# Patient Record
Sex: Female | Born: 2000 | Hispanic: Yes | Marital: Married | Smoking: Never smoker
Health system: Southern US, Community
[De-identification: ages and names within clinical notes are randomized; demographics above are authoritative.]

---

## 2021-01-29 ENCOUNTER — Encounter (HOSPITAL_COMMUNITY): Payer: Self-pay | Admitting: Obstetrics and Gynecology

## 2021-01-29 ENCOUNTER — Inpatient Hospital Stay (HOSPITAL_COMMUNITY)
Admission: AD | Admit: 2021-01-29 | Discharge: 2021-01-30 | Disposition: A | Discharge status: Home / Self Care | Payer: Self-pay | Attending: Obstetrics and Gynecology | Admitting: Obstetrics and Gynecology

## 2021-01-29 DIAGNOSIS — Z20822 Contact with and (suspected) exposure to covid-19: Secondary | ICD-10-CM | POA: Insufficient documentation

## 2021-01-29 DIAGNOSIS — Z3A34 34 weeks gestation of pregnancy: Secondary | ICD-10-CM | POA: Insufficient documentation

## 2021-01-29 DIAGNOSIS — J111 Influenza due to unidentified influenza virus with other respiratory manifestations: Secondary | ICD-10-CM | POA: Insufficient documentation

## 2021-01-29 DIAGNOSIS — O26893 Other specified pregnancy related conditions, third trimester: Secondary | ICD-10-CM | POA: Insufficient documentation

## 2021-01-29 DIAGNOSIS — R109 Unspecified abdominal pain: Secondary | ICD-10-CM | POA: Insufficient documentation

## 2021-01-29 DIAGNOSIS — R102 Pelvic and perineal pain: Secondary | ICD-10-CM | POA: Insufficient documentation

## 2021-01-29 DIAGNOSIS — M545 Low back pain, unspecified: Secondary | ICD-10-CM | POA: Insufficient documentation

## 2021-01-29 DIAGNOSIS — O0933 Supervision of pregnancy with insufficient antenatal care, third trimester: Secondary | ICD-10-CM | POA: Insufficient documentation

## 2021-01-29 DIAGNOSIS — J101 Influenza due to other identified influenza virus with other respiratory manifestations: Secondary | ICD-10-CM

## 2021-01-29 DIAGNOSIS — O4700 False labor before 37 completed weeks of gestation, unspecified trimester: Secondary | ICD-10-CM

## 2021-01-29 DIAGNOSIS — O99513 Diseases of the respiratory system complicating pregnancy, third trimester: Secondary | ICD-10-CM | POA: Insufficient documentation

## 2021-01-29 LAB — URINALYSIS, MICROSCOPIC (REFLEX): WBC, UA: >50 WBC/hpf (ref 0–5)

## 2021-01-29 LAB — WET PREP, GENITAL
Sperm: NONE SEEN
Trich, Wet Prep: NONE SEEN
WBC, Wet Prep HPF POC: >=10 — AB (ref <10)
Yeast Wet Prep HPF POC: NONE SEEN

## 2021-01-29 LAB — BASIC METABOLIC PANEL
Anion gap: 8 (ref 5–15)
BUN: 5 mg/dL — ABNORMAL LOW (ref 6–20)
CO2: 22 mmol/L (ref 22–32)
Calcium: 8.1 mg/dL — ABNORMAL LOW (ref 8.9–10.3)
Chloride: 104 mmol/L (ref 98–111)
Creatinine, Ser: 0.48 mg/dL (ref 0.44–1.00)
GFR, Estimated: >60 mL/min (ref >60)
Glucose, Bld: 95 mg/dL (ref 70–99)
Potassium: 3.3 mmol/L — ABNORMAL LOW (ref 3.5–5.1)
Sodium: 134 mmol/L — ABNORMAL LOW (ref 135–145)

## 2021-01-29 LAB — CBC WITH DIFFERENTIAL/PLATELET
Abs Immature Granulocytes: 0.05 10*3/uL (ref 0.00–0.07)
Basophils Absolute: 0 10*3/uL (ref 0.0–0.1)
Basophils Relative: 0 %
Eosinophils Absolute: 0 10*3/uL (ref 0.0–0.5)
Eosinophils Relative: 0 %
HCT: 30.8 % — ABNORMAL LOW (ref 36.0–46.0)
Hemoglobin: 10.4 g/dL — ABNORMAL LOW (ref 12.0–15.0)
Immature Granulocytes: 1 %
Lymphocytes Relative: 15 %
Lymphs Abs: 1.5 10*3/uL (ref 0.7–4.0)
MCH: 29.2 pg (ref 26.0–34.0)
MCHC: 33.8 g/dL (ref 30.0–36.0)
MCV: 86.5 fL (ref 80.0–100.0)
Monocytes Absolute: 0.6 10*3/uL (ref 0.1–1.0)
Monocytes Relative: 6 %
Neutro Abs: 7.9 10*3/uL — ABNORMAL HIGH (ref 1.7–7.7)
Neutrophils Relative %: 78 %
Platelets: 239 10*3/uL (ref 150–400)
RBC: 3.56 MIL/uL — ABNORMAL LOW (ref 3.87–5.11)
RDW: 13.3 % (ref 11.5–15.5)
WBC: 10 10*3/uL (ref 4.0–10.5)
nRBC: 0 % (ref 0.0–0.2)

## 2021-01-29 LAB — URINALYSIS, ROUTINE W REFLEX MICROSCOPIC
Bilirubin Urine: NEGATIVE
Glucose, UA: NEGATIVE mg/dL
Hgb urine dipstick: NEGATIVE
Ketones, ur: >80 mg/dL — AB
Nitrite: NEGATIVE
Protein, ur: NEGATIVE mg/dL
Specific Gravity, Urine: 1.02 (ref 1.005–1.030)
pH: 6.5 (ref 5.0–8.0)

## 2021-01-29 LAB — RESP PANEL BY RT-PCR (FLU A&B, COVID) ARPGX2
Influenza A by PCR: POSITIVE — AB
Influenza B by PCR: NEGATIVE
SARS Coronavirus 2 by RT PCR: NEGATIVE

## 2021-01-29 LAB — OB RESULTS CONSOLE GC/CHLAMYDIA: Gonorrhea: NEGATIVE

## 2021-01-29 LAB — GROUP B STREP BY PCR: Group B strep by PCR: NEGATIVE

## 2021-01-29 MED ORDER — NIFEDIPINE 10 MG PO CAPS
10.0000 mg | ORAL_CAPSULE | ORAL | Status: DC | PRN
  Administered 2021-01-29 (×2): 10 mg via ORAL
  Filled 2021-01-29 (×3): qty 1

## 2021-01-29 MED ORDER — LACTATED RINGERS IV BOLUS
1000.0000 mL | Freq: Once | INTRAVENOUS | Status: AC
  Administered 2021-01-29: 1000 mL via INTRAVENOUS

## 2021-01-29 MED ORDER — OSELTAMIVIR PHOSPHATE 75 MG PO CAPS
75.0000 mg | ORAL_CAPSULE | Freq: Once | ORAL | Status: AC
  Administered 2021-01-30: 75 mg via ORAL
  Filled 2021-01-29: qty 1

## 2021-01-29 MED ORDER — ACETAMINOPHEN 500 MG PO TABS
1000.0000 mg | ORAL_TABLET | Freq: Once | ORAL | Status: AC
  Administered 2021-01-29: 1000 mg via ORAL
  Filled 2021-01-29: qty 2

## 2021-01-29 MED ORDER — LACTATED RINGERS IV SOLN: Freq: Once | INTRAVENOUS | Status: AC

## 2021-01-29 NOTE — MAU Provider Note (Addendum)
Chief Complaint:  Abdominal Pain   Event Date/Time   First Provider Initiated Contact with Patient 01/29/21 2101   Interpretor used throughout   HPI: Latoya Lane is a 20 y.o. G3P1001 at 25w3dwho presents to maternity admissions reporting pain in groin and low back to RN.  To me she states she has lower abdominal pain.  Denies sore throat or cough when told she has a fever. States has a little congestion.  States walked from [place] to [place] 2 months ago.  Living with parents here. No prenatal care  States HD told her to call [facility], and she got an answering machine. . She reports good fetal movement, denies LOF, vaginal bleeding, vaginal itching/burning, urinary symptoms, h/a, dizziness, n/v, diarrhea  Abdominal Pain This is a new problem. The current episode started in the past 7 days. The onset quality is gradual. The problem occurs constantly. The quality of the pain is described as cramping. The pain radiates to the back. Associated symptoms include a fever. Pertinent negatives include no frequency, headaches, myalgias or nausea. Nothing relieves the symptoms. Past treatments include nothing.   RN Note: PT SAYS  WITH INTERPRETER- PT CAME HERE 2 MTHS - FROM [place] NO PNC THERE EITHER - ONLY 1 VISIT SAYS HER TAIL BONE AND GROINS HURT- STARTED Sunday - NO MEDS  LAST SEX- WHEN PREG   Past Medical History: History reviewed. No pertinent past medical history.  Past obstetric history: OB History  Gravida Para Term Preterm AB Living  3 1 1     1   SAB IAB Ectopic Multiple Live Births          1    # Outcome Date GA Lbr Len/2nd Weight Sex Delivery Anes PTL Lv  3 Current           2 Term           1 Gravida      Vag-Spont       Past Surgical History: History reviewed. No pertinent surgical history.  Family History: History reviewed. No pertinent family history.  Social History: Social History   Tobacco Use   Smoking status: Never   Smokeless tobacco: Never   Substance Use Topics   Alcohol use: Never   Drug use: Never    Allergies: No Known Allergies  Meds:  No medications prior to admission.    I have reviewed patient's Past Medical Hx, Surgical Hx, Family Hx, Social Hx, medications and allergies.   ROS:  Review of Systems  Constitutional:  Positive for fever.  Gastrointestinal:  Positive for abdominal pain. Negative for nausea.  Genitourinary:  Negative for frequency.  Musculoskeletal:  Negative for myalgias.  Neurological:  Negative for headaches.  Other systems negative  Physical Exam  Patient Vitals for the past 24 hrs:  BP Temp Temp src Pulse Resp Height Weight  01/29/21 2029 111/71 (!) 101.3 F (38.5 C) Oral (!) 108 20 5' (1.524 m) 53.3 kg   Constitutional: Well-developed, well-nourished female in no acute distress.  Cardiovascular: normal rate and rhythm Respiratory: normal effort, clear to auscultation bilaterally  Oxygen saturation 100% GI: Abd soft, non-tender, gravid appropriate for gestational age.   No rebound or guarding. MS: Extremities nontender, no edema, normal ROM Neurologic: Alert and oriented x 4.  GU: Neg CVAT.  PELVIC EXAM: Cultures and GBS done Dilation: Fingertip Effacement (%): Thick Cervical Position: Posterior Station: -3 Presentation: Undeterminable Exam by:: Khaleah Duer CNM    FHT:  Baseline 150 , moderate variability, accelerations  present, no decelerations Contractions: q 3 mins Irregular  UCs are mild and short   Labs: Results for orders placed or performed during the hospital encounter of 01/29/21 (from the past 24 hour(s))  Urinalysis, Routine w reflex microscopic Urine, Clean Catch     Status: Abnormal   Collection Time: 01/29/21  8:47 PM  Result Value Ref Range   Color, Urine YELLOW YELLOW   APPearance CLOUDY (A) CLEAR   Specific Gravity, Urine 1.020 1.005 - 1.030   pH 6.5 5.0 - 8.0   Glucose, UA NEGATIVE NEGATIVE mg/dL   Hgb urine dipstick NEGATIVE NEGATIVE   Bilirubin Urine  NEGATIVE NEGATIVE   Ketones, ur >80 (A) NEGATIVE mg/dL   Protein, ur NEGATIVE NEGATIVE mg/dL   Nitrite NEGATIVE NEGATIVE   Leukocytes,Ua MODERATE (A) NEGATIVE  Urinalysis, Microscopic (reflex)     Status: Abnormal   Collection Time: 01/29/21  8:47 PM  Result Value Ref Range   RBC / HPF 0-5 0 - 5 RBC/hpf   WBC, UA >50 0 - 5 WBC/hpf   Bacteria, UA FEW (A) NONE SEEN   Squamous Epithelial / LPF 21-50 0 - 5  Resp Panel by RT-PCR (Flu A&B, Covid) Urine, Clean Catch     Status: Abnormal   Collection Time: 01/29/21  8:48 PM   Specimen: Urine, Clean Catch; Nasopharyngeal(NP) swabs in vial transport medium  Result Value Ref Range   SARS Coronavirus 2 by RT PCR NEGATIVE NEGATIVE   Influenza A by PCR POSITIVE (A) NEGATIVE   Influenza B by PCR NEGATIVE NEGATIVE  Wet prep, genital     Status: Abnormal   Collection Time: 01/29/21  9:27 PM  Result Value Ref Range   Yeast Wet Prep HPF POC NONE SEEN NONE SEEN   Trich, Wet Prep NONE SEEN NONE SEEN   Clue Cells Wet Prep HPF POC PRESENT (A) NONE SEEN   WBC, Wet Prep HPF POC >=10 (A) <10   Sperm NONE SEEN   Group B strep by PCR     Status: None   Collection Time: 01/29/21  9:29 PM   Specimen: Genital  Result Value Ref Range   Group B strep by PCR NEGATIVE NEGATIVE  CBC with Differential/Platelet     Status: Abnormal   Collection Time: 01/29/21 10:08 PM  Result Value Ref Range   WBC 10.0 4.0 - 10.5 K/uL   RBC 3.56 (L) 3.87 - 5.11 MIL/uL   Hemoglobin 10.4 (L) 12.0 - 15.0 g/dL   HCT 79.0 (L) 24.0 - 97.3 %   MCV 86.5 80.0 - 100.0 fL   MCH 29.2 26.0 - 34.0 pg   MCHC 33.8 30.0 - 36.0 g/dL   RDW 53.2 99.2 - 42.6 %   Platelets 239 150 - 400 K/uL   nRBC 0.0 0.0 - 0.2 %   Neutrophils Relative % 78 %   Neutro Abs 7.9 (H) 1.7 - 7.7 K/uL   Lymphocytes Relative 15 %   Lymphs Abs 1.5 0.7 - 4.0 K/uL   Monocytes Relative 6 %   Monocytes Absolute 0.6 0.1 - 1.0 K/uL   Eosinophils Relative 0 %   Eosinophils Absolute 0.0 0.0 - 0.5 K/uL   Basophils Relative  0 %   Basophils Absolute 0.0 0.0 - 0.1 K/uL   Immature Granulocytes 1 %   Abs Immature Granulocytes 0.05 0.00 - 0.07 K/uL  Basic metabolic panel     Status: Abnormal   Collection Time: 01/29/21 10:08 PM  Result Value Ref Range   Sodium 134 (  L) 135 - 145 mmol/L   Potassium 3.3 (L) 3.5 - 5.1 mmol/L   Chloride 104 98 - 111 mmol/L   CO2 22 22 - 32 mmol/L   Glucose, Bld 95 70 - 99 mg/dL   BUN 5 (L) 6 - 20 mg/dL   Creatinine, Ser 4.80 0.44 - 1.00 mg/dL   Calcium 8.1 (L) 8.9 - 10.3 mg/dL   GFR, Estimated >16 >55 mL/min   Anion gap 8 5 - 15      Imaging:  No results found.  MAU Course/MDM: I have ordered labs and reviewed results. Positive for Flu A.  NST reviewed, noted uterine irritability/contractions.  Treatments in MAU included Procardia series x 2 doses (BP was too low).  So we gave her 2 liters of IV fluids which slowed contractions We also gave Tylenol for fever We also gave her first dose of Tamiflu .    Assessment: Single IUP at [redacted]w[redacted]d Pelvic cramping/preterm contractions, likely due to Flu and dehydration Influenza A No prenatal care  Plan: Discharge home Rx Tamiflu given Rx Prenatal vitamins Preterm Labor precautions and fetal kick counts Follow up in Office for prenatal visits    Message sent to office to  try to schedule her a new OB visit  Pt stable at time of discharge.  Wynelle Bourgeois CNM, MSN Certified Nurse-Midwife 01/29/2021 9:01 PM

## 2021-01-29 NOTE — MAU Note (Signed)
PT SAYS  WITH INTERPRETER- PT CAME HERE 2 MTHS - FROM [place] NO PNC THERE EITHER - ONLY 1 VISIT SAYS HER TAIL BONE AND GROINS HURT- STARTED Sunday - NO MEDS  LAST SEX- WHEN PREG

## 2021-01-30 DIAGNOSIS — O4700 False labor before 37 completed weeks of gestation, unspecified trimester: Secondary | ICD-10-CM

## 2021-01-30 DIAGNOSIS — Z3A34 34 weeks gestation of pregnancy: Secondary | ICD-10-CM

## 2021-01-30 LAB — GC/CHLAMYDIA PROBE AMP (~~LOC~~) NOT AT ARMC
Chlamydia: NEGATIVE
Comment: NEGATIVE
Comment: NORMAL
Neisseria Gonorrhea: NEGATIVE

## 2021-01-30 LAB — CULTURE, OB URINE: Culture: >=10000 — AB

## 2021-01-30 MED ORDER — PRENATAL VITAMIN 27-0.8 MG PO TABS: 1.0000 | ORAL_TABLET | Freq: Every day | ORAL | 12 refills | Status: AC

## 2021-01-30 MED ORDER — OSELTAMIVIR PHOSPHATE 75 MG PO CAPS: 75.0000 mg | ORAL_CAPSULE | Freq: Two times a day (BID) | ORAL | 0 refills | Status: DC

## 2021-02-17 NOTE — L&D Delivery Note (Signed)
OB/GYN Faculty Practice Delivery Note  Latoya Lane is a 21 y.o. G2P1001 s/p VD at [redacted]w[redacted]d. She was admitted for a post dates IOL.   Spanish interpreter present for the duration of care.   ROM: 0h 35m with clear fluid GBS Status:  NEGATIVE/-- (12/13 2129) Maximum Maternal Temperature: 48F  Labor Progress: Initial SVE: 3.5/70/-3. She was augmented with pit and then progressed quickly to complete after AROM (5 to 10cm in 44 minutes)   Delivery Date/Time: 1/26 at 1729  Delivery: Called to room and patient was complete and pushing. Head delivered R OA. Nuchal cord present and delivered through. Posterior compound arm present and delivered before the remainder of shoulder and body. Infant with spontaneous cry, placed on mother's abdomen, dried and stimulated. Cord clamped x 2 after 1-minute delay, and cut by provider. Cord blood drawn. Placenta delivered spontaneously with gentle cord traction. Fundus firm with massage and Pitocin. Labia, perineum, vagina, and cervix inspected with no lacerations.  Baby Weight: pending  Placenta: 3 vessel, intact. Sent to L&D Complications: None Lacerations: None EBL: 250 mL Analgesia: None   Infant:  APGAR (1 MIN): 9   APGAR (5 MINS): 9    Darrelyn Hillock, DO  OB Family Medicine Fellow, [facility] for [facility] 03/14/2021, 5:51 PM

## 2021-02-25 ENCOUNTER — Ambulatory Visit (INDEPENDENT_AMBULATORY_CARE_PROVIDER_SITE_OTHER): Payer: Self-pay | Admitting: Family Medicine

## 2021-02-25 ENCOUNTER — Encounter: Payer: Self-pay | Admitting: Family Medicine

## 2021-02-25 ENCOUNTER — Other Ambulatory Visit: Payer: Self-pay

## 2021-02-25 VITALS — BP 110/71 | HR 72 | Wt 125.1 lb

## 2021-02-25 DIAGNOSIS — Z3A38 38 weeks gestation of pregnancy: Secondary | ICD-10-CM

## 2021-02-25 DIAGNOSIS — O26843 Uterine size-date discrepancy, third trimester: Secondary | ICD-10-CM

## 2021-02-25 DIAGNOSIS — Z789 Other specified health status: Secondary | ICD-10-CM

## 2021-02-25 DIAGNOSIS — O0930 Supervision of pregnancy with insufficient antenatal care, unspecified trimester: Secondary | ICD-10-CM

## 2021-02-25 DIAGNOSIS — Z3493 Encounter for supervision of normal pregnancy, unspecified, third trimester: Secondary | ICD-10-CM

## 2021-02-25 DIAGNOSIS — Z3480 Encounter for supervision of other normal pregnancy, unspecified trimester: Secondary | ICD-10-CM | POA: Insufficient documentation

## 2021-02-25 LAB — POCT URINALYSIS DIP (DEVICE)
Bilirubin Urine: NEGATIVE
Glucose, UA: NEGATIVE mg/dL
Hgb urine dipstick: NEGATIVE
Ketones, ur: NEGATIVE mg/dL
Nitrite: NEGATIVE
Protein, ur: NEGATIVE mg/dL
Specific Gravity, Urine: 1.025 (ref 1.005–1.030)
Urobilinogen, UA: 0.2 mg/dL (ref 0.0–1.0)
pH: 6 (ref 5.0–8.0)

## 2021-02-25 NOTE — Progress Notes (Signed)
Scheduled ultrasound at [facility] 1/23 @ 930

## 2021-02-25 NOTE — Progress Notes (Addendum)
History:   Latoya Lane is a 21 y.o. G2P0001 at [redacted]w[redacted]d by LMP being seen today for her first obstetrical visit.  She just moved here in 11/2021 from [place] (her parents live here) and has received no prenatal care during this pregnancy. Her obstetrical history is significant for  a normal SVD in 2021, healthy son now 1 year 21 months old . No complications in that pregnancy. Patient does intend to breast feed. Pregnancy history fully reviewed.  Patient reports  occasional pelvic pain when she sits or stands for long periods of time .   Reports feeling sure of her LMP. Not on contraception before with normal last menstrual cycle. Previously monthly cycles.   Spanish interpreter used.    HISTORY: OB History  Gravida Para Term Preterm AB Living  2 0 0 0 0 1  SAB IAB Ectopic Multiple Live Births  0 0 0 0 1    # Outcome Date GA Lbr Len/2nd Weight Sex Delivery Anes PTL Lv  2 Current           1 Gravida      Vag-Spont        History reviewed. No pertinent past medical history. History reviewed. No pertinent surgical history. History reviewed. No pertinent family history. Social History   Tobacco Use   Smoking status: Never   Smokeless tobacco: Never  Substance Use Topics   Alcohol use: Never   Drug use: Never   No Known Allergies Current Outpatient Medications on File Prior to Visit  Medication Sig Dispense Refill   Prenatal Vit-Fe Fumarate-FA (PRENATAL VITAMIN) 27-0.8 MG TABS Take 1 tablet by mouth daily. 30 tablet 12   oseltamivir (TAMIFLU) 75 MG capsule Take 1 capsule (75 mg total) by mouth every 12 (twelve) hours. (Patient not taking: Reported on 02/25/2021) 10 capsule 0   No current facility-administered medications on file prior to visit.    Review of Systems Pertinent items noted in HPI and remainder of comprehensive ROS otherwise negative. Physical Exam:   Vitals:   02/25/21 1026  BP: 110/71  Pulse: 72  Weight: 125 lb 1.6 oz (56.7 kg)   Fetal Heart Rate  (bpm): 146 Fundal height 35cm.   Constitutional: Well-developed, well-nourished pregnant female in no acute distress.  HEENT: PERRLA Skin: normal color and turgor, no rash Cardiovascular: normal rate  Respiratory: normal effort GI: Abd soft, non-tender, gravid uterus  MS: Extremities nontender, no edema, normal ROM Neurologic: Alert and oriented x 4.  Pelvic: NEFG, physiologic discharge, no blood.  Bedside US confirmed VERTEX presentation.   Dilation: 1.5 Effacement (%): 40 Station: -3 Presentation: Vertex   Assessment:    Pregnancy: G2P0001 [redacted] weeks gestation by LMP    Plan:    1. Supervision of low-risk pregnancy, third trimester Doing well with normal fetal movement.  - CBC/D/Plt+RPR+Rh+ABO+RubIgG... - Hemoglobin A1c - [place] MFM OB DETAIL +14 WK; Future - AMBULATORY REFERRAL TO [facility]  2. [redacted] weeks gestation of pregnancy  3. Fundal height low for dates in third trimester Measuring around 34-35cm. No previous US to assess fetal growth. She is receiving PNC through the [employer] program with next available [place] on 1/23. Will see if able to switch locations for earlier assessment. May also be with fetal movement lower into her pelvis with advanced gestation.     Initial labs drawn. Continue prenatal vitamins. Problem list reviewed and updated. Ultrasound discussed and ordered.  Encouraged to complete MyChart Registration for her ability to review results, send requests,  and have questions addressed.  The nature of [facility]ulty Practice with multiple MDs and Advanced Practice Providers was explained to patient; also emphasized that residents, students are part of our team. Routine obstetric precautions reviewed. Encouraged to seek out care at office or emergency room [facility] MAU preferred) for urgent and/or emergent concerns.  Return in about 1 week (around 03/04/2021) for LROB.     Leticia Penna, DO Ob Fellow   02/25/2021   2:50 PM

## 2021-02-25 NOTE — Progress Notes (Signed)
Patient reports daily pain in pelvis

## 2021-02-26 LAB — CBC/D/PLT+RPR+RH+ABO+RUBIGG...
Antibody Screen: NEGATIVE
Basophils Absolute: 0.1 10*3/uL (ref 0.0–0.2)
Basos: 1 %
EOS (ABSOLUTE): 0.2 10*3/uL (ref 0.0–0.4)
Eos: 2 %
HCV Ab: <0.1 s/co ratio (ref 0.0–0.9)
HIV Screen 4th Generation wRfx: NONREACTIVE
Hematocrit: 31.8 % — ABNORMAL LOW (ref 34.0–46.6)
Hemoglobin: 10.5 g/dL — ABNORMAL LOW (ref 11.1–15.9)
Hepatitis B Surface Ag: NEGATIVE
Immature Grans (Abs): 0.1 10*3/uL (ref 0.0–0.1)
Immature Granulocytes: 1 %
Lymphocytes Absolute: 3.4 10*3/uL — ABNORMAL HIGH (ref 0.7–3.1)
Lymphs: 38 %
MCH: 27.5 pg (ref 26.6–33.0)
MCHC: 33 g/dL (ref 31.5–35.7)
MCV: 83 fL (ref 79–97)
Monocytes Absolute: 0.6 10*3/uL (ref 0.1–0.9)
Monocytes: 7 %
Neutrophils Absolute: 4.7 10*3/uL (ref 1.4–7.0)
Neutrophils: 51 %
Platelets: 267 10*3/uL (ref 150–450)
RBC: 3.82 x10E6/uL (ref 3.77–5.28)
RDW: 13.8 % (ref 11.7–15.4)
RPR Ser Ql: NONREACTIVE
Rh Factor: POSITIVE
Rubella Antibodies, IGG: <0.9 index — ABNORMAL LOW (ref >0.99)
WBC: 9 10*3/uL (ref 3.4–10.8)

## 2021-02-26 LAB — HEMOGLOBIN A1C
Est. average glucose Bld gHb Est-mCnc: 111 mg/dL
Hgb A1c MFr Bld: 5.5 % (ref 4.8–5.6)

## 2021-02-26 LAB — HCV INTERPRETATION

## 2021-03-01 ENCOUNTER — Encounter: Payer: Self-pay | Admitting: *Deleted

## 2021-03-04 ENCOUNTER — Encounter: Payer: Self-pay | Admitting: Obstetrics and Gynecology

## 2021-03-04 ENCOUNTER — Ambulatory Visit (INDEPENDENT_AMBULATORY_CARE_PROVIDER_SITE_OTHER): Payer: Self-pay | Admitting: Obstetrics and Gynecology

## 2021-03-04 ENCOUNTER — Other Ambulatory Visit: Payer: Self-pay

## 2021-03-04 ENCOUNTER — Other Ambulatory Visit: Payer: Self-pay | Admitting: Family Medicine

## 2021-03-04 ENCOUNTER — Ambulatory Visit (INDEPENDENT_AMBULATORY_CARE_PROVIDER_SITE_OTHER): Payer: Self-pay

## 2021-03-04 VITALS — BP 118/76 | HR 63 | Wt 126.8 lb

## 2021-03-04 DIAGNOSIS — O26843 Uterine size-date discrepancy, third trimester: Secondary | ICD-10-CM

## 2021-03-04 DIAGNOSIS — Z789 Other specified health status: Secondary | ICD-10-CM

## 2021-03-04 DIAGNOSIS — O09899 Supervision of other high risk pregnancies, unspecified trimester: Secondary | ICD-10-CM | POA: Insufficient documentation

## 2021-03-04 DIAGNOSIS — Z3480 Encounter for supervision of other normal pregnancy, unspecified trimester: Secondary | ICD-10-CM

## 2021-03-04 DIAGNOSIS — Z2839 Other underimmunization status: Secondary | ICD-10-CM | POA: Insufficient documentation

## 2021-03-04 DIAGNOSIS — O0933 Supervision of pregnancy with insufficient antenatal care, third trimester: Secondary | ICD-10-CM

## 2021-03-04 IMAGING — US US FETAL BPP W/ NON-STRESS
1 series · 14 of 14 positions shown · non-contrast
Comparison: none

[Series 1: us fetal bpp w/ non-stress · 14 acquisitions, 14 frames shown]
[im 1/14]
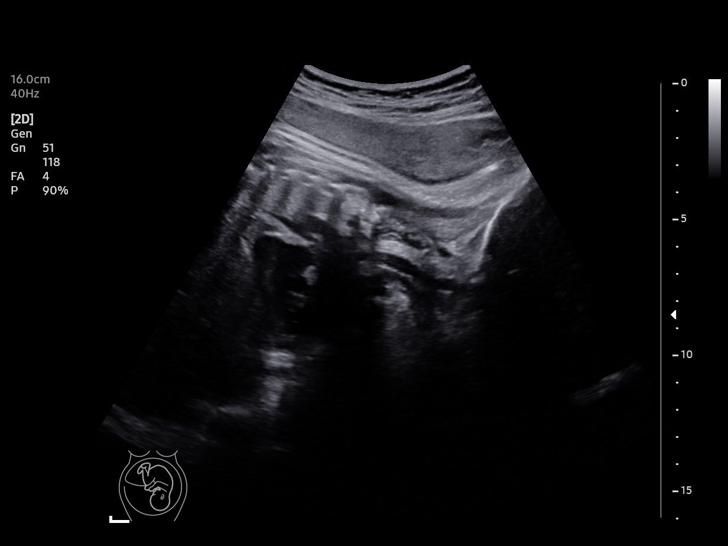
[im 2/14]
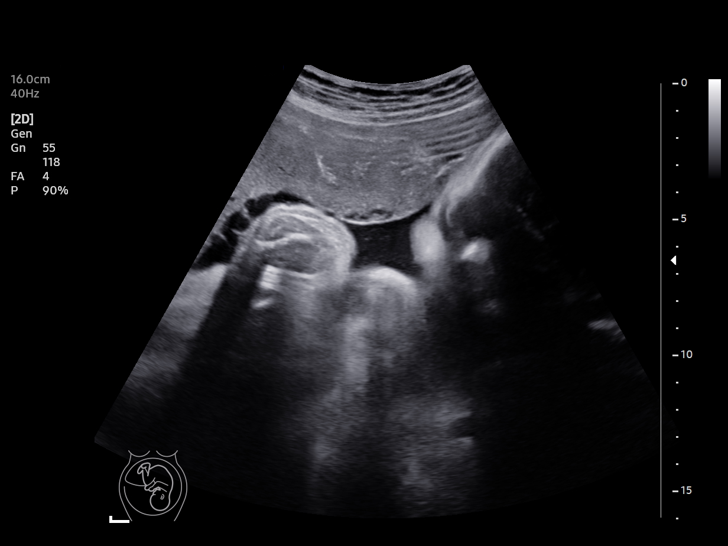
[im 3/14]
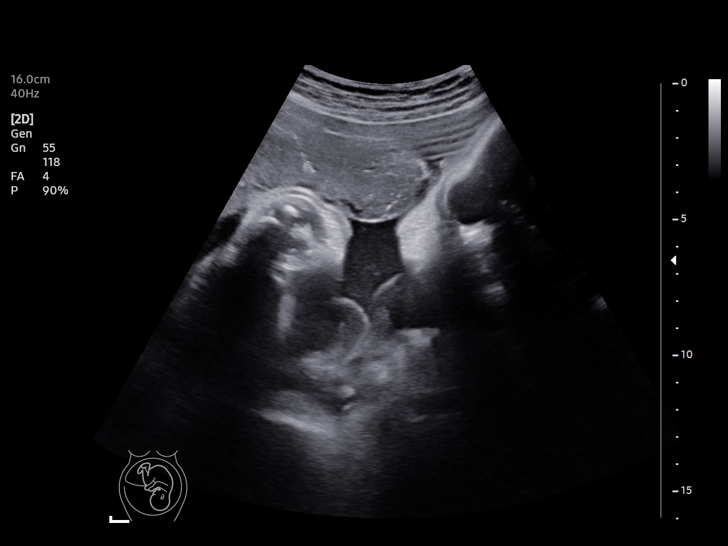
[im 4/14]
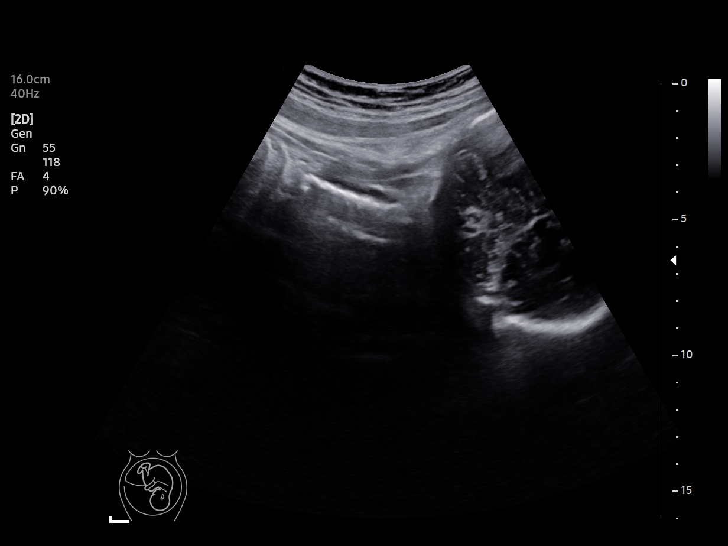
[im 5/14]
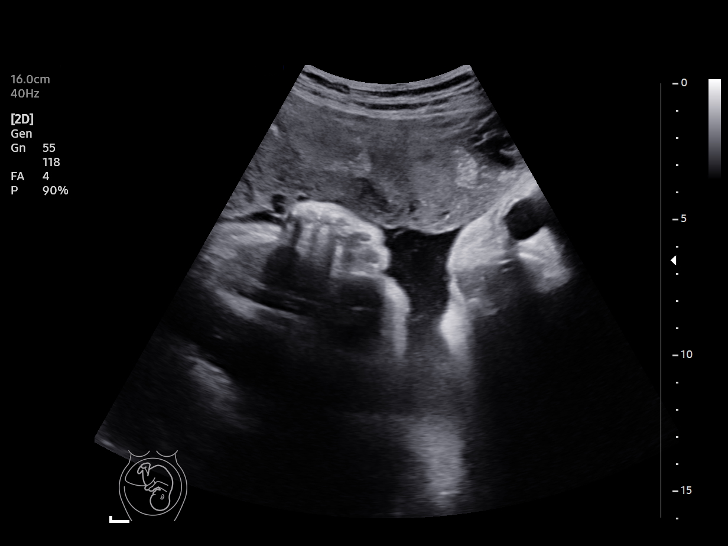
[im 6/14]
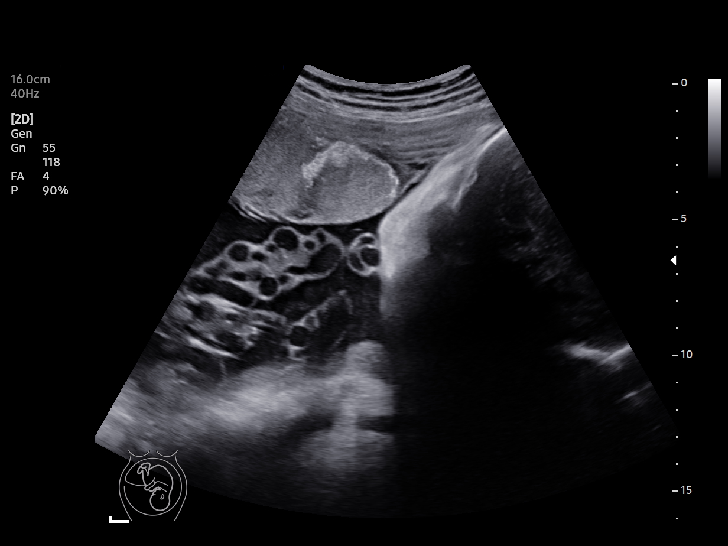
[im 7/14]
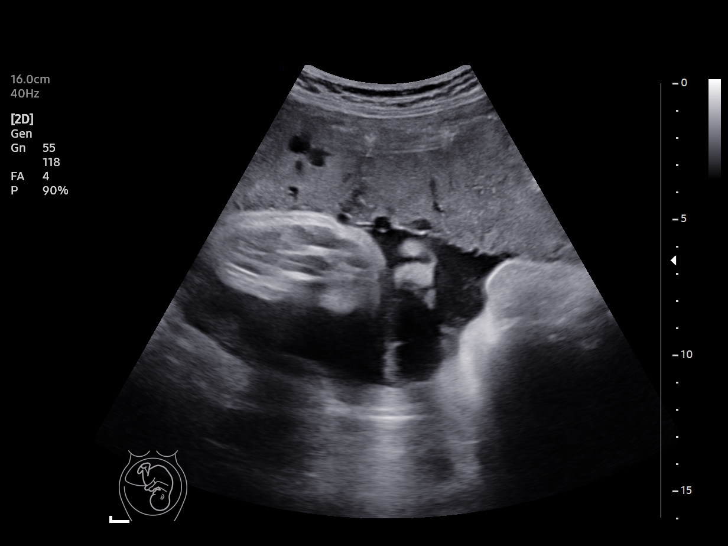
[im 8/14]
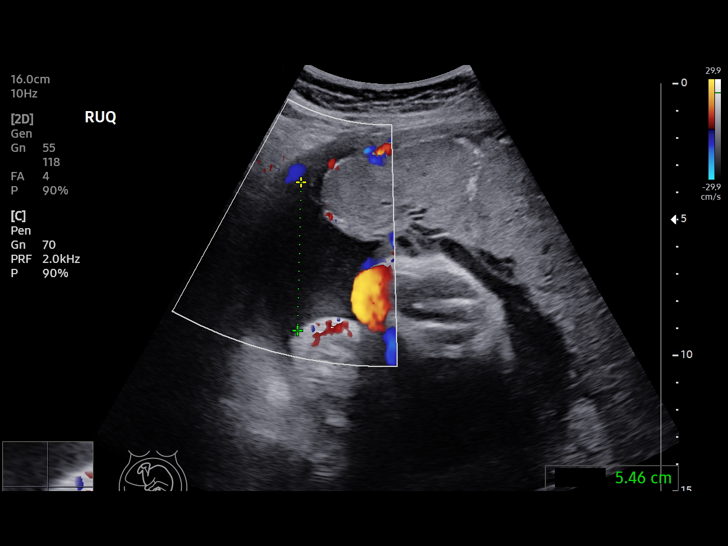
[im 9/14]
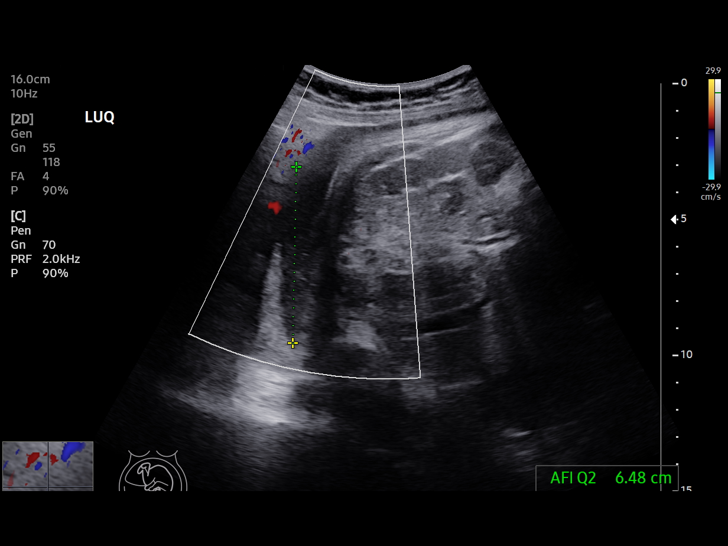
[im 10/14]
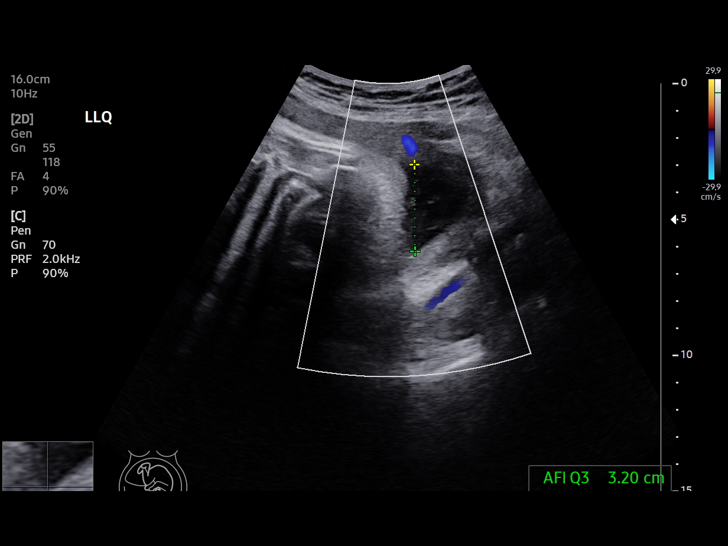
[im 11/14]
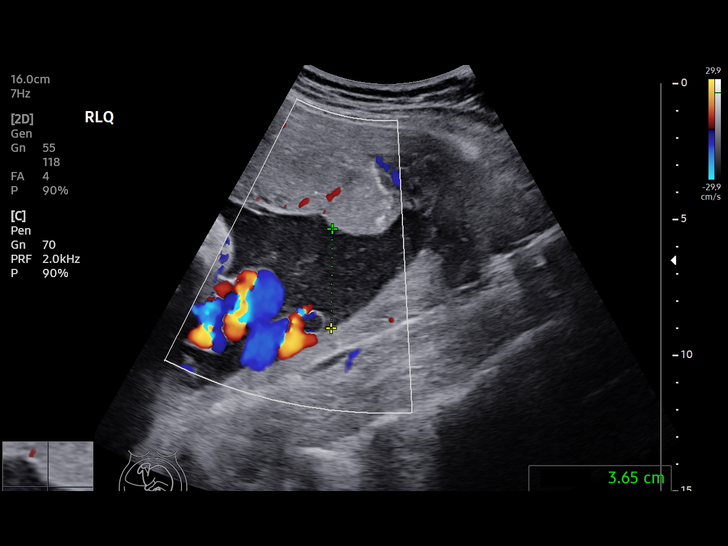
[im 12/14]
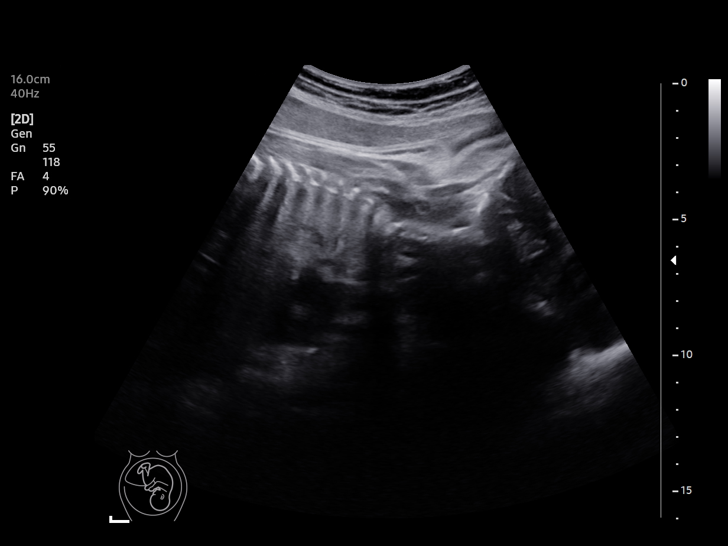
[im 13/14]
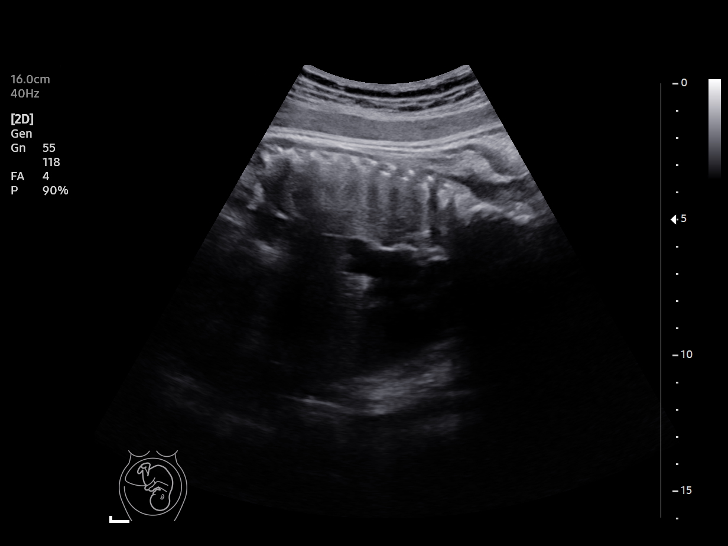
[im 14/14]
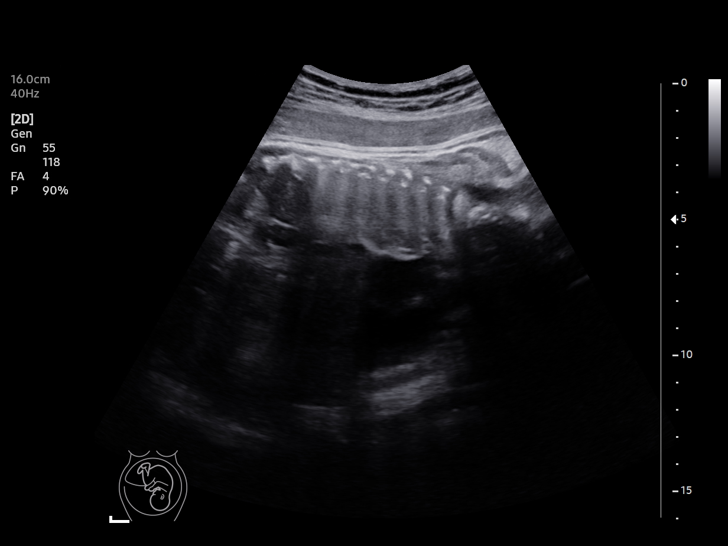

[14 of 14 positions shown; findings below may reference images not displayed]

[REDACTED]care at

 1  US FETAL BPP W/NONSTRESS              76818.4     SVETLANA GANDRABUR

Service(s) Provided

Indications

 Weeks of gestation of pregnancy not
 specified
 Insufficient Prenatal Care                     [HN]
 Uterine size-date discrepancy, third trimester [HN]
Fetal Evaluation

 Num Of Fetuses:         1
 Preg. Location:         Intrauterine
 Cardiac Activity:       Observed
 Presentation:           Cephalic

 Amniotic Fluid
 AFI FV:      Within normal limits

 AFI Sum(cm)                 Largest Pocket(cm)


 RUQ(cm)       RLQ(cm)       LUQ(cm)        LLQ(cm)


 Comment:    Breathing noted intermittently, but not sustained.
Biophysical Evaluation
 Amniotic F.V:   Pocket => 2 cm             F. Tone:        Observed
 F. Movement:    Observed                   N.S.T:          Reactive
 F. Breathing:   Not Observed               Score:          [DATE]
Impression

 Reassuring antenatal care
Recommendations

 Continue weekly antenatal testing till delivery .
                SVETLANA GANDRABUR

## 2021-03-04 NOTE — Progress Notes (Signed)
° °  PRENATAL VISIT NOTE  Subjective:  Latoya Lane is a 21 y.o. G2P0001 at [redacted]w[redacted]d being seen today for ongoing prenatal care.  She is currently monitored for the following issues for this high-risk pregnancy and has Supervision of other normal pregnancy, antepartum; Fundal height low for dates, third trimester; Late prenatal care; Language barrier; and Rubella non-immune status, antepartum on their problem list.  Patient reports no complaints.  Contractions: Irritability. Vag. Bleeding: None.  Movement: Present. Denies leaking of fluid.   The following portions of the patient's history were reviewed and updated as appropriate: allergies, current medications, past family history, past medical history, past social history, past surgical history and problem list.   Objective:   Vitals:   03/04/21 1520  BP: 118/76  Pulse: 63  Weight: 126 lb 12.8 oz (57.5 kg)    Fetal Status: Fetal Heart Rate (bpm): 146 Fundal Height: 37 cm Movement: Present  Presentation: Vertex  General:  Alert, oriented and cooperative. Patient is in no acute distress.  Skin: Skin is warm and dry. No rash noted.   Cardiovascular: Normal heart rate noted  Respiratory: Normal respiratory effort, no problems with respiration noted  Abdomen: Soft, gravid, appropriate for gestational age.  Pain/Pressure: Present     Pelvic: Cervical exam performed in the presence of a chaperone Dilation: 2 Effacement (%): 50 Station: -2  Extremities: Normal range of motion.  Edema: Trace  Mental Status: Normal mood and affect. Normal behavior. Normal judgment and thought content.   Assessment and Plan:  Pregnancy: G2P0001 at [redacted]w[redacted]d 1. Supervision of other normal pregnancy, antepartum Has 1/23 [place] anatomy u/s. Pt states she has never had an u/s this pregnancy from anyone and that her periods were qmonth and regular prior to pregnancy. FH is borderline low but patient is small too. Labs from last visit normal  F/u [place] u/s next  week Will get an nst/bpp today and next week given poor dates, with [place] u/s Pt and mom amenable to 41wk post dates IOL  2. Insufficient prenatal care in third trimester  3.  Language barrier Interpreter used  Term labor symptoms and general obstetric precautions including but not limited to vaginal bleeding, contractions, leaking of fluid and fetal movement were reviewed in detail with the patient. Please refer to After Visit Summary for other counseling recommendations.   Return in about 1 week (around 03/11/2021) for 7-9 days for LOB f/u - *Needs transportation*.  Future Appointments  Date Time Provider Leroy  03/11/2021  3:55 PM Osborne Oman, MD [facility]  03/14/2021  6:30 AM [facility] SCHED ROOM [facility] None    Aletha Halim, MD

## 2021-03-04 NOTE — Patient Instructions (Signed)
AREA PEDIATRIC/FAMILY PRACTICE PHYSICIANS  Central/[place]outheast [place] ([address]) [facility] Chambliss, MD; Eniola, MD; Hale, MD; Hensel, MD; McDiarmid, MD; McIntyer, MD; Neal, MD; Walden, MD [address] [place], [place] [address] [phone] Mon-Fri 8:30-12:30, 1:30-5:00 Providers come to see babies at [facility] Accepting Medicaid [facility] providers who accept newborns: Koirala, MD; Morrow, MD; Wolters, MD [address], [place], [place] [address] [phone] Mon-Fri 8:00-5:30 Babies seen by providers at [facility] Does NOT accept Medicaid Please call early in hospitalization for appointment (limited availability)  [facility] Mulberry, MD [address] [place], [place] [address] [phone] Mon, Tue, Thur, Fri 8:30-5:00, Wed 10:00-7:00 (closed 1-2pm) Babies seen by [facility] providers Accepting Medicaid Rubin - Pediatrician Rubin, MD [address], [place], [place] [address] [phone] Mon-Fri 8:30-5:00, Sat 8:30-12:00 Provider comes to see babies at [facility] Accepting Medicaid Must have been referred from current patients or contacted office prior to delivery [facility] ([facility]) Brown, MD; Chandler, MD; Ettefagh, MD; Grant, MD; Lester, MD; McCormick, MD; McQueen, MD; Prose, MD; Simha, MD; Stanley, MD; Stryffeler, NP; Tebben, NP [address], [place], [place] [address] [phone] Mon, Tue, Thur, Fri 8:30-5:30, Wed 9:30-5:30, Sat 8:30-12:30 Babies seen by [facility] providers Accepting Medicaid Only accepting infants of first-time parents or siblings of current patients Hospital discharge coordinator will make follow-up appointment Jack Amos [address], [place], [place]  [address] [phone]   Fax - [phone] [facility] [address], [place], [place]  [address] Phone - [phone]   Fax - [phone] Shilpa Gosrani [address], [place], [place]  [address] [phone]  [place] ([address]) [facility] Bates, MD; Brassfield, MD; Cooper, Cox, MD; MD; Davis, MD; Dovico, MD; Ettefaugh, MD; Little, MD; Lowe, MD; Keiffer, MD; Melvin, MD; Sumner, MD; Williams, MD [address], [place], [place] [address] [phone] Mon-Fri 8:30-5:00 (extended evenings Mon-Thur as needed), Sat-Sun 10:00-1:00 Providers come to see babies at [facility] Accepting Medicaid for families of first-time babies and families with all children in the household age 3 and under. Must register with office prior to making appointment (M-F only). [facility] Henson, NP; Knapp, MD; Lalonde, MD; Tysinger, PA [address] [place], [place] [address] [phone] Mon-Fri 8:00-5:00 Babies seen by providers at [facility] Does NOT accept Medicaid/Commercial Insurance Only [facility] ([facility])  Artis, MD; Barnes, MD; Bratton, MD; Coccaro, MD; Lockett Gardner, MD; Kramer, MD; Marshall, MD; Netherton, MD; Poleto, MD; Skinner, MD [address] [place], [place] [address] [phone] Mon-Fri 8:30-5:30, Sat (Oct.-Mar.) 9:00-1:00 Babies seen by providers at [facility] Accepting Medicaid  [place] ([address]) [facility] Reid, MD; Warner, MD [address], ,  [address] [phone] Mon-Fri 8:30-5:00, Sat 8:30-12:00 Providers come to see babies at [facility] Does NOT accept Medicaid [facility] Becker, PA; Hagler, MD; Scifres, PA; Sun, MD; Swayne, MD [address], ,  [address] [phone] Mon-Fri 8:00-5:00 Babies seen by providers at [facility] Does NOT accept Medicaid Only accepting babies of parents who  are patients Please call early in hospitalization for appointment (limited availability)  Pediatricians Clark, MD; Frye, MD; Kelleher, MD; Mack, NP; Miller, MD; O'Keller, MD; Patterson, NP; Pudlo, MD; Puzio, MD; Thomas, MD; Tucker, MD; Twiselton, MD [address], [place], [place] [address] [phone] Mon-Fri 8:00-5:00, Sat 9:00-12:00 Providers come to see babies at [facility] Does NOT accept Medicaid  [place] ([address]) [facility] providers accepting new patients: Brake, NP; Wharton, PA [address], [place], [place] [address] [phone] Mon-Fri 8:00-5:00 Babies seen by providers at [facility] Does NOT accept Medicaid Only accepting babies of parents who are patients Please call early in hospitalization for appointment (limited availability) [facility] Gay, MD; Quinlan, MD [address] [place], [place] [address] [phone] (press [phone] to schedule appointment) Mon-Fri 8:00-5:00 Providers come to see babies at [facility] Does NOT accept Medicaid [facility] Mazer, MD [address] [place], [place] [address] [phone] Mon-Fri 8:30-5:00 (lunch 12:30-1:00), extended hours by appointment only Wed 5:00-6:30 Babies seen by [facility] providers Accepting Medicaid [facility] Banks, MD; Jordan, MD; Koberlein, MD [address], [place], [place] [address] [phone] Mon-Fri 8:00-5:00 Babies seen by [facility] providers Does NOT accept Medicaid [facility] Parker, MD; Hunter, MD; Wallace, DO [address] [place], [place] [address] [phone] Mon-Fri 8:00-5:00 Babies seen by [facility] providers Does NOT accept Medicaid [facility] Brandon, PA; Brecken, PA; Christy, NP; Dees, MD; DeClaire, MD; DeWeese, MD; Hansen, NP; Mills, NP; Parrish, NP; Smoot, NP; Summer, MD; Vapne,  MD [address] [place], [place] [address] [phone] Mon-Fri 8:30-5:00, Sat 10:00-1:00 Providers come to see babies at [facility] Does NOT accept Medicaid Free prenatal information session Tuesdays at 4:45pm [facility] Bouska, MD; Gordon, PA; Jeffery, PA; Weber, PA [address] [place] [address] [phone] Mon-Fri 7:30-5:30 Babies seen by [facility] providers [facility] [address], [place], [place]  [address] [phone]   Fax - [phone]  [place] ([address] & [address]) [facility] [place] [address] [place], [place] [address] [phone] Mon-Thur 8:00-6:00 Providers come to see babies at [facility] Accepting Medicaid [facility] Anderson, NP; Badger, MD; Beal, PA; Spencer, PA [address] [place], [place] [address] [phone] Mon-Thur 7:30-7:30, Fri 7:30-4:30 Babies seen by [facility] providers Accepting Medicaid [facility] Agbuya, MD; Klett, NP; Romgoolam, MD [address], [place], [place] [address] [phone] Mon-Fri 8:30-5:00, Sat 8:30-12:00 Providers come to see babies at [facility] Accepting Medicaid Must have "Meet & Greet" appointment at office prior to delivery [facility] ([facility]) McCord, MD; Wallace, MD; Wood, MD [address], [place], [place] [address] [phone] Mon-Wed 8:00-6:00, Thur-Fri 8:00-5:00, Sat 9:00-12:00 Providers come to see babies at [facility] Does NOT accept Medicaid Only accepting siblings of current patients [facility]  [address], [place], [place]  [address] [phone]   Fax - [phone] [facility] [address], [place], [place]  [address] [phone]   Fax -  [phone]  [place] ([address] & [address]) [facility] Cirigliano, DO; Matthews, DO [address] [place], [place] [address] [phone] Mon-Fri 7:00-5:00 Babies seen by [facility] providers Does NOT accept Medicaid [facility] Briscoe, MD; Howley, PA; Moreira, PA [address], [place], [place] [address] [phone] Mon-Fri 8:00-5:00 Babies seen by [facility] providers Accepting Medicaid [facility] Boyd, MD; Church, PA; Jones, NP; [place], PA [address], ,  [address] [phone] Mon-Fri 8:00-5:00 Babies seen by providers at [facility] Accepting Medicaid  [place] ([address]) [facility] [place] Wendling, DO [address] [place], [place] [address] [phone] Mon-Fri 8:00-5:00 Babies seen by [facility] providers Does NOT accept Medicaid Limited availability, please call early in hospitalization to schedule follow-up [facility] Calderon, PA; Cummings, MD; Dillard, MD; Martin, PA; Olson, MD; VanDeven, PA [address], [place], [place] [address] [phone] Mon-Fri 8:30-5:00, Sat 9:00-12:00 Babies seen by providers at [facility] Accepting Medicaid Please register online then schedule online or call office [email or web address]) Hunter, NP; Kumar, MD; Martin Rogers, PA [address], [place], [place] [address] [phone] Mon-Fri 8:00-5:00 Babies seen by providers at [facility] Accepting Medicaid [facility] ([facility]) [place], MD; Kristi Fleenor, NP; West, MD [address], [place], [place] [address] [phone] Mon-Fri 8:00-5:30, Sat&Sun by appointment (phones open at  8:30) Babies seen by [facility] providers Accepting Medicaid Must be a first-time baby or sibling of current patient [facility] [place]  [address], [place], [place]  [address] [phone]   Fax - [phone]  [place] ([address]) [facility] Brown, PA; Cowen, PA; Rice, MD; Helton, PA; Spry, MD [address] [place], [place] [address] [phone] Mon-Thur 8:00-7:00, Fri 8:00-5:00, Sat 8:00-12:00, Sun 9:00-12:00 Babies seen by [facility] providers Accepting Medicaid [facility] Coe-Goins, MD; Marshall, MD; Pierre-Louis, MD [address], [place], [place] [address] [phone] Mon-Thur 8:00-5:00 Babies seen by providers at [facility] Accepting Medicaid [facility] Bratton, MD; Coe-Goins, MD; Hayes, MD; Lewis, MD; List, MD; Lott, MD; Marshall, MD; Moran, MD; O'Neal, MD; Pierre-Louis, MD; Pitonzo, MD; Scholer, MD; Spangle, MD [address] [place], [place] [address] [phone] Mon-Fri 8:00-5:30, Sat (Oct.-Mar.) 9:00-1:00 Babies seen by providers at [facility] Accepting Medicaid Must fill out new patient packet, available online at [email or web address] [facility] ([facility]) Friddle, NP; Harris, NP; Kelly, NP; Logan, MD; Melvin, PA; Poth, MD; Ramadoss, MD; Stanton, NP [address], [place], [place] [address] [phone] Mon-Thur 8:00-5:30, Fri 8:00-5:00 Babies seen by providers at [facility] Accepting Medicaid  [place] ([address]) [facility] Dixon, PA; Dwight, MD; Pickard, MD; Tapia, PA [address], [place], [place] [address] [phone] Mon-Fri 8:00-5:00 Babies seen by providers at [facility] Accepting Medicaid   [place] ([address]) [facility] [place] Masneri, DO; Meyers, MD; [place], PA [address], [place], [place] [address] [phone] Mon-Fri 8:00-5:00 Babies seen by providers at [facility] Does NOT accept Medicaid Limited appointment availability, please call early in hospitalization  [facility] [place] Kunedd, DO; McGowen, MD [address], [place], [place] [address] [phone] Mon-Fri 8:00-5:00 Babies seen by [facility] providers Does NOT accept Medicaid [facility] [place] Cameron, MD; MacDonald, MD; Michaels, PA; Nayak, MD [address] [place] [address], [place], [place] [address] [phone] Mon-Fri 8:00-5:00 After hours clinic ([address] [place],  [address]) [phone] Mon-Fri 5:00-8:00, Sat 12:00-6:00, Sun 10:00-4:00 Babies seen by [facility] providers Accepting Medicaid [facility] [place] [address], [place], [place]  [address] [phone]   Fax - [phone]  [place] ([address]) [facility] [place] [facility] Andy, MD [address], [place], [place] [address] [phone] Mon-Fri 8:00-5:00 Babies seen by [facility] providers Does NOT accept Medicaid [facility] [place] ([facility] [place]) Eksir, MD [address], [place], [place] [address] [phone] Mon-Thur 8:00-7:00, Fri 8:00-5:00, Sat 8:00-12:00 Babies seen by providers at [facility] Accepting Medicaid - but does not have vaccinations in office (must be received elsewhere) Limited availability, please call early in hospitalization  [place] ([address]) [facility]  Charlene Flemming, MD [address], [place] [address] [phone]  Fax [phone]  [facility] [facility]  Kimberly Newton, MD, Annamarie Streilein, PA, Carla Hampton, PA [address] [place], [place]  [address] [phone] [facility]  [address], [place], [place] [address] [phone] [address], [place], [place] [address] [phone] (West Office)  [facility] [address], [place], [place] [address] [phone] [facility] [address], [place], [place] [address] [phone] [facility] [address], [place], [place] [address] [phone] [facility] [address], [place], [place] [address] [phone] [facility] [address], [place], [place] [address] [phone] [facility] [address], [place], [place] [address] [phone] [facility]  [address], [place], [place] [address] [phone] Dr[facility] [address], [place], [place] [address] [phone] [facility] [address], [place], [place] [address] [phone] [facility] [address], [place],  [address] [phone]  

## 2021-03-05 ENCOUNTER — Encounter (HOSPITAL_COMMUNITY): Payer: Self-pay | Admitting: *Deleted

## 2021-03-05 ENCOUNTER — Telehealth (HOSPITAL_COMMUNITY): Payer: Self-pay | Admitting: *Deleted

## 2021-03-05 NOTE — Telephone Encounter (Signed)
[id number] interpreter number Preadmission screen

## 2021-03-06 ENCOUNTER — Other Ambulatory Visit: Payer: Self-pay | Admitting: Advanced Practice Midwife

## 2021-03-11 ENCOUNTER — Other Ambulatory Visit: Payer: Self-pay

## 2021-03-11 ENCOUNTER — Encounter: Payer: Self-pay | Admitting: Family Medicine

## 2021-03-11 ENCOUNTER — Ambulatory Visit (INDEPENDENT_AMBULATORY_CARE_PROVIDER_SITE_OTHER): Payer: Self-pay | Admitting: Family Medicine

## 2021-03-11 VITALS — BP 120/75 | HR 76 | Wt 126.7 lb

## 2021-03-11 DIAGNOSIS — Z2839 Other underimmunization status: Secondary | ICD-10-CM

## 2021-03-11 DIAGNOSIS — O09899 Supervision of other high risk pregnancies, unspecified trimester: Secondary | ICD-10-CM

## 2021-03-11 DIAGNOSIS — Z3480 Encounter for supervision of other normal pregnancy, unspecified trimester: Secondary | ICD-10-CM

## 2021-03-11 DIAGNOSIS — O26843 Uterine size-date discrepancy, third trimester: Secondary | ICD-10-CM

## 2021-03-11 DIAGNOSIS — Z3A40 40 weeks gestation of pregnancy: Secondary | ICD-10-CM

## 2021-03-11 DIAGNOSIS — Z789 Other specified health status: Secondary | ICD-10-CM

## 2021-03-11 NOTE — Progress Notes (Signed)
° ° °  Subjective:  Latoya Lane is a 21 y.o. G3P2001 at 22w2dbeing seen today for ongoing prenatal care.  She is currently monitored for the following issues for this low-risk pregnancy and has Supervision of other normal pregnancy, antepartum; Fundal height low for dates, third trimester; Late prenatal care; Language barrier; and Rubella non-immune status, antepartum on their problem list.  Patient reports occasional contractions.  Contractions: Irritability. Vag. Bleeding: None.  Movement: Present. Denies leaking of fluid.   The following portions of the patient's history were reviewed and updated as appropriate: allergies, current medications, past family history, past medical history, past social history, past surgical history and problem list.   Objective:   Vitals:   03/11/21 1608  BP: 120/75  Pulse: 76  Weight: 126 lb 11.2 oz (57.5 kg)    Fetal Status: Fetal Heart Rate (bpm): 144 Fundal Height: 36 cm Movement: Present     General:  Alert, oriented and cooperative. Patient is in no acute distress.  Skin: Skin is warm and dry. No rash noted.   Cardiovascular: Normal heart rate noted  Respiratory: Normal respiratory effort, no problems with respiration noted  Abdomen: Soft, gravid, appropriate for gestational age. Pain/Pressure: Present     Pelvic:  Cervical exam performed in the presence of a chaperone Dilation: 2 Effacement (%): 50 Station: -3  Extremities: Normal range of motion.  Edema: None  Mental Status: Normal mood and affect. Normal behavior. Normal judgment and thought content.    Assessment and Plan:  Pregnancy: G3P2001 at 474w2d1. Supervision of other normal pregnancy, antepartum Doing well with normal fetal movement. Pd IOL scheduled for 1/26. [place] US[place]one this morning, patient reports "no concerns". Awaiting records for this. Recollected GBS culture today, last had a PCR done in the [facility] in early Dec (which was neg).  - Culture, beta strep (group b  only)  2. [redacted] weeks gestation of pregnancy Discussed IOL methods. Encouraging eating breakfast prior to showing up on L&D.   3. Rubella non-immune status, antepartum MMR pp.   4. Language barrier Spanish interpreter used.   5. Fundal height low for dates, third trimester Awaiting USKoreaesults. Endorses sure LMP.    Term labor symptoms and general obstetric precautions including but not limited to vaginal bleeding, contractions, leaking of fluid and fetal movement were reviewed in detail with the patient. Please refer to After Visit Summary for other counseling recommendations.    BePatriciaann ClanDO

## 2021-03-12 ENCOUNTER — Encounter (HOSPITAL_COMMUNITY): Payer: Self-pay | Admitting: Obstetrics & Gynecology

## 2021-03-14 ENCOUNTER — Encounter (HOSPITAL_COMMUNITY): Payer: Self-pay | Admitting: Obstetrics and Gynecology

## 2021-03-14 ENCOUNTER — Inpatient Hospital Stay (HOSPITAL_COMMUNITY)
Admission: AD | Admit: 2021-03-14 | Discharge: 2021-03-16 | DRG: 807 | Disposition: A | Discharge status: Home / Self Care | Payer: Self-pay | Attending: Obstetrics and Gynecology | Admitting: Obstetrics and Gynecology

## 2021-03-14 ENCOUNTER — Inpatient Hospital Stay (HOSPITAL_COMMUNITY): Payer: Self-pay

## 2021-03-14 DIAGNOSIS — Z3A40 40 weeks gestation of pregnancy: Secondary | ICD-10-CM

## 2021-03-14 DIAGNOSIS — Z3480 Encounter for supervision of other normal pregnancy, unspecified trimester: Secondary | ICD-10-CM

## 2021-03-14 DIAGNOSIS — O0930 Supervision of pregnancy with insufficient antenatal care, unspecified trimester: Secondary | ICD-10-CM

## 2021-03-14 DIAGNOSIS — Z23 Encounter for immunization: Secondary | ICD-10-CM

## 2021-03-14 DIAGNOSIS — Z2839 Other underimmunization status: Secondary | ICD-10-CM

## 2021-03-14 DIAGNOSIS — Z789 Other specified health status: Secondary | ICD-10-CM | POA: Diagnosis present

## 2021-03-14 DIAGNOSIS — O365990 Maternal care for other known or suspected poor fetal growth, unspecified trimester, not applicable or unspecified: Secondary | ICD-10-CM | POA: Diagnosis present

## 2021-03-14 DIAGNOSIS — Z20822 Contact with and (suspected) exposure to covid-19: Secondary | ICD-10-CM | POA: Diagnosis present

## 2021-03-14 DIAGNOSIS — O480 Post-term pregnancy: Principal | ICD-10-CM | POA: Diagnosis present

## 2021-03-14 DIAGNOSIS — O09899 Supervision of other high risk pregnancies, unspecified trimester: Secondary | ICD-10-CM

## 2021-03-14 DIAGNOSIS — O365930 Maternal care for other known or suspected poor fetal growth, third trimester, not applicable or unspecified: Secondary | ICD-10-CM | POA: Diagnosis present

## 2021-03-14 LAB — RESP PANEL BY RT-PCR (FLU A&B, COVID) ARPGX2
Influenza A by PCR: NEGATIVE
Influenza B by PCR: NEGATIVE
SARS Coronavirus 2 by RT PCR: NEGATIVE

## 2021-03-14 LAB — CBC
HCT: 32.2 % — ABNORMAL LOW (ref 36.0–46.0)
Hemoglobin: 11 g/dL — ABNORMAL LOW (ref 12.0–15.0)
MCH: 28.9 pg (ref 26.0–34.0)
MCHC: 34.2 g/dL (ref 30.0–36.0)
MCV: 84.7 fL (ref 80.0–100.0)
Platelets: 280 10*3/uL (ref 150–400)
RBC: 3.8 MIL/uL — ABNORMAL LOW (ref 3.87–5.11)
RDW: 16.4 % — ABNORMAL HIGH (ref 11.5–15.5)
WBC: 8.4 10*3/uL (ref 4.0–10.5)
nRBC: 0 % (ref 0.0–0.2)

## 2021-03-14 LAB — TYPE AND SCREEN
ABO/RH(D): O POS
Antibody Screen: NEGATIVE

## 2021-03-14 LAB — RPR: RPR Ser Ql: NONREACTIVE

## 2021-03-14 MED ORDER — ONDANSETRON HCL 4 MG/2ML IJ SOLN: 4.0000 mg | INTRAMUSCULAR | Status: DC | PRN

## 2021-03-14 MED ORDER — SODIUM CHLORIDE 0.9% FLUSH: 3.0000 mL | Freq: Two times a day (BID) | INTRAVENOUS | Status: DC

## 2021-03-14 MED ORDER — SODIUM CHLORIDE 0.9 % IV SOLN: 250.0000 mL | INTRAVENOUS | Status: DC | PRN

## 2021-03-14 MED ORDER — OXYTOCIN BOLUS FROM INFUSION
333.0000 mL | Freq: Once | INTRAVENOUS | Status: AC
  Administered 2021-03-14: 333 mL via INTRAVENOUS

## 2021-03-14 MED ORDER — TERBUTALINE SULFATE 1 MG/ML IJ SOLN: 0.2500 mg | Freq: Once | INTRAMUSCULAR | Status: DC | PRN

## 2021-03-14 MED ORDER — SIMETHICONE 80 MG PO CHEW: 80.0000 mg | CHEWABLE_TABLET | ORAL | Status: DC | PRN

## 2021-03-14 MED ORDER — OXYTOCIN-SODIUM CHLORIDE 30-0.9 UT/500ML-% IV SOLN
2.5000 [IU]/h | INTRAVENOUS | Status: DC
  Administered 2021-03-14: 2.5 [IU]/h via INTRAVENOUS
  Filled 2021-03-14: qty 500

## 2021-03-14 MED ORDER — ZOLPIDEM TARTRATE 5 MG PO TABS: 5.0000 mg | ORAL_TABLET | Freq: Every evening | ORAL | Status: DC | PRN

## 2021-03-14 MED ORDER — FENTANYL CITRATE (PF) 100 MCG/2ML IJ SOLN
50.0000 ug | INTRAMUSCULAR | Status: DC | PRN
  Administered 2021-03-14: 50 ug via INTRAVENOUS
  Filled 2021-03-14: qty 2

## 2021-03-14 MED ORDER — ONDANSETRON HCL 4 MG PO TABS: 4.0000 mg | ORAL_TABLET | ORAL | Status: DC | PRN

## 2021-03-14 MED ORDER — OXYTOCIN-SODIUM CHLORIDE 30-0.9 UT/500ML-% IV SOLN
1.0000 m[IU]/min | INTRAVENOUS | Status: DC
  Administered 2021-03-14: 2 m[IU]/min via INTRAVENOUS

## 2021-03-14 MED ORDER — TETANUS-DIPHTH-ACELL PERTUSSIS 5-2.5-18.5 LF-MCG/0.5 IM SUSY
0.5000 mL | PREFILLED_SYRINGE | Freq: Once | INTRAMUSCULAR | Status: AC
  Administered 2021-03-15: 0.5 mL via INTRAMUSCULAR
  Filled 2021-03-14: qty 0.5

## 2021-03-14 MED ORDER — BENZOCAINE-MENTHOL 20-0.5 % EX AERO: 1.0000 "application " | INHALATION_SPRAY | CUTANEOUS | Status: DC | PRN

## 2021-03-14 MED ORDER — LIDOCAINE HCL (PF) 1 % IJ SOLN: 30.0000 mL | INTRAMUSCULAR | Status: DC | PRN

## 2021-03-14 MED ORDER — IBUPROFEN 600 MG PO TABS
600.0000 mg | ORAL_TABLET | Freq: Four times a day (QID) | ORAL | Status: DC
  Administered 2021-03-15 – 2021-03-16 (×6): 600 mg via ORAL
  Filled 2021-03-14 (×6): qty 1

## 2021-03-14 MED ORDER — LACTATED RINGERS IV SOLN: 500.0000 mL | INTRAVENOUS | Status: DC | PRN

## 2021-03-14 MED ORDER — PRENATAL MULTIVITAMIN CH
1.0000 | ORAL_TABLET | Freq: Every day | ORAL | Status: DC
  Administered 2021-03-15 – 2021-03-16 (×2): 1 via ORAL
  Filled 2021-03-14 (×2): qty 1

## 2021-03-14 MED ORDER — ONDANSETRON HCL 4 MG/2ML IJ SOLN: 4.0000 mg | Freq: Four times a day (QID) | INTRAMUSCULAR | Status: DC | PRN

## 2021-03-14 MED ORDER — COCONUT OIL OIL: 1.0000 "application " | TOPICAL_OIL | Status: DC | PRN

## 2021-03-14 MED ORDER — ACETAMINOPHEN 325 MG PO TABS: 650.0000 mg | ORAL_TABLET | ORAL | Status: DC | PRN

## 2021-03-14 MED ORDER — DIPHENHYDRAMINE HCL 25 MG PO CAPS: 25.0000 mg | ORAL_CAPSULE | Freq: Four times a day (QID) | ORAL | Status: DC | PRN

## 2021-03-14 MED ORDER — LACTATED RINGERS IV SOLN: INTRAVENOUS | Status: DC

## 2021-03-14 MED ORDER — MISOPROSTOL 25 MCG QUARTER TABLET: 25.0000 ug | ORAL_TABLET | ORAL | Status: DC | PRN

## 2021-03-14 MED ORDER — MEASLES, MUMPS & RUBELLA VAC IJ SOLR
0.5000 mL | Freq: Once | INTRAMUSCULAR | Status: AC
  Administered 2021-03-15: 0.5 mL via SUBCUTANEOUS
  Filled 2021-03-14: qty 0.5

## 2021-03-14 MED ORDER — SODIUM CHLORIDE 0.9% FLUSH: 3.0000 mL | INTRAVENOUS | Status: DC | PRN

## 2021-03-14 MED ORDER — SENNOSIDES-DOCUSATE SODIUM 8.6-50 MG PO TABS
2.0000 | ORAL_TABLET | ORAL | Status: DC
  Administered 2021-03-15 – 2021-03-16 (×2): 2 via ORAL
  Filled 2021-03-14 (×2): qty 2

## 2021-03-14 MED ORDER — DIBUCAINE (PERIANAL) 1 % EX OINT: 1.0000 "application " | TOPICAL_OINTMENT | CUTANEOUS | Status: DC | PRN

## 2021-03-14 MED ORDER — WITCH HAZEL-GLYCERIN EX PADS: 1.0000 "application " | MEDICATED_PAD | CUTANEOUS | Status: DC | PRN

## 2021-03-14 MED ORDER — SOD CITRATE-CITRIC ACID 500-334 MG/5ML PO SOLN: 30.0000 mL | ORAL | Status: DC | PRN

## 2021-03-14 NOTE — Progress Notes (Signed)
In-house spanish interpreter at bedside.  Pt made aware interpretive services always available and voiced understand.  Reviewed admission and plan of care.  Breakfast ordered for patient.  All questions answered at this time.

## 2021-03-14 NOTE — Lactation Note (Signed)
This note was copied from a baby's chart. Lactation Consultation Note  Patient Name: Latoya Lane GSUPJ'S Date: 03/14/2021 Reason for consult: Initial assessment;Mother's request;Term;Breastfeeding assistance Age:23 hours  Latoya Lane #[id number] served Spanish interpreter for this visit  LC assisted with changing latch from cradle to football to get more depth on breast. Infant fed 3 ml of colostrum.  Mom hand pump to pre pump before latching.  Plan 1. To feed based on cues 8-12x 24hr period. Mom to offer breasts and look for signs of milk transfer.  2. If unable to latch, Mom to hand express and offer eBM via spoon 5- 7 ml . 3. I and O sheet reviewed.  All questions answered at the end of the visit.   Maternal Data Has patient been taught Hand Expression?: Yes Does the patient have breastfeeding experience prior to this delivery?: Yes How long did the patient breastfeed?: 8 months  Feeding Mother's Current Feeding Choice: Breast Milk and Formula  LATCH Score Latch: Repeated attempts needed to sustain latch, nipple held in mouth throughout feeding, stimulation needed to elicit sucking reflex.  Audible Swallowing: A few with stimulation  Type of Nipple: Flat (Nipples areola edema hard infant to get depth on breast. MOm to use hand pump pre pump before latching)  Comfort (Breast/Nipple): Soft / non-tender  Hold (Positioning): Assistance needed to correctly position infant at breast and maintain latch.  LATCH Score: 6   Lactation Tools Discussed/Used Tools: Pump;Flanges Flange Size: 24 Breast pump type: Manual Pump Education: Setup, frequency, and cleaning;Milk Storage Reason for Pumping: elongate nipple Pumping frequency: every 3 hrs for 10 min each breast  Interventions Interventions: Breast feeding basics reviewed;Assisted with latch;Skin to skin;Breast massage;Hand express;Pre-pump if needed;Breast compression;Adjust position;Support pillows;Position options;Expressed  milk;Hand pump;Education;LC Psychologist, educational;Visual merchandiser education  Discharge WIC Program: Yes  Consult Status Consult Status: Follow-up Date: 03/15/21 Follow-up type: In-patient    Latoya Lane  Latoya Lane 03/14/2021, 9:26 PM

## 2021-03-14 NOTE — Progress Notes (Signed)
LABOR PROGRESS NOTE  Latoya Lane is a 21 y.o. G2P1001 at [redacted]w[redacted]d  admitted for IOL d/t post dates.   Subjective: Dating is based on pt's recollection of LMP which she feels certain about. Pt states contractions are feeling stronger.  Objective: BP 117/72    Pulse 63    Temp 98 F (36.7 C) (Oral)    Resp 16    Ht 5' 1.81" (1.57 m)    Wt 57.5 kg    LMP 06/02/2020    BMI 23.33 kg/m  or  Vitals:   03/14/21 0739 03/14/21 0851 03/14/21 1130 03/14/21 1230  BP: 108/80 106/73 109/76 117/72  Pulse: 77 68 65 63  Resp: 18 16 16 16   Temp: (!) 97.3 F (36.3 C)  98 F (36.7 C)   TempSrc: Oral  Oral   Weight: 57.5 kg     Height: 5' 1.81" (1.57 m)        Dilation: 3.5 Effacement (%): 70 Cervical Position: Posterior Station: -3 Presentation: Vertex Exam by:: Dr. [email or web address] FHT: baseline rate 145, moderate varibility, accels present, no decels Toco: irregular contractions, 8 in the past hour.   Labs: Lab Results  Component Value Date   WBC 8.4 03/14/2021   HGB 11.0 (L) 03/14/2021   HCT 32.2 (L) 03/14/2021   MCV 84.7 03/14/2021   PLT 280 03/14/2021    Patient Active Problem List   Diagnosis Date Noted   Post-dates pregnancy 03/14/2021   Rubella non-immune status, antepartum 03/04/2021   Supervision of other normal pregnancy, antepartum 02/25/2021   Fundal height low for dates, third trimester 02/25/2021   Late prenatal care 02/25/2021   Language barrier 02/25/2021    Assessment / Plan: 21 y.o. G2P1001 at [redacted]w[redacted]d here for IOL d/t post dates.  [redacted]w[redacted]d done on 03/11/21 estimates GA of [redacted]w[redacted]d today however pt feels very confident about date of LMP which puts her at [redacted]w[redacted]d. The cervix is favorable and contractions are becoming stronger. Pt agrees to continue with the induction process.   Labor: Cervix is favorable. Will start induction with pitocin. Fetal Wellbeing:  cat 1 Pain Control:  PRN Anticipated MOD:  SVD  [redacted]w[redacted]d PGY1, family medicine resident  03/14/2021, 1:14 PM

## 2021-03-14 NOTE — Progress Notes (Signed)
LABOR PROGRESS NOTE  Latoya Lane is a 21 y.o. G2P1001 at [redacted]w[redacted]d  admitted for IOL d/t pot dates.  Subjective: Pt is feeling contractions more. Does not want epidural but requests fentanyl.   Objective: BP 127/62    Pulse 72    Temp 98 F (36.7 C) (Oral)    Resp 16    Ht 5' 1.81" (1.57 m)    Wt 57.5 kg    LMP 06/02/2020    BMI 23.33 kg/m  or  Vitals:   03/14/21 1500 03/14/21 1530 03/14/21 1600 03/14/21 1630  BP: 125/81 108/69 119/71 127/62  Pulse: 75 (!) 59 64 72  Resp:  18 18 16   Temp:      TempSrc:      Weight:      Height:         Dilation: 5 Effacement (%): 80 Cervical Position: Posterior Station: -2 Presentation: Vertex Exam by:: Dr. [email or web address] FHT: baseline rate 145, moderate varibility, accels present, no decels Toco: ctx q 2-3 minutes  Labs: Lab Results  Component Value Date   WBC 8.4 03/14/2021   HGB 11.0 (L) 03/14/2021   HCT 32.2 (L) 03/14/2021   MCV 84.7 03/14/2021   PLT 280 03/14/2021    Patient Active Problem List   Diagnosis Date Noted   Post-dates pregnancy 03/14/2021   Fetal growth restriction antepartum 03/14/2021   Rubella non-immune status, antepartum 03/04/2021   Supervision of other normal pregnancy, antepartum 02/25/2021   Fundal height low for dates, third trimester 02/25/2021   Late prenatal care 02/25/2021   Language barrier 02/25/2021    Assessment / Plan:   Labor: making progress on pit. AROM at 1645 Fetal Wellbeing:  cat 1 Pain Control:  fentanyl  Anticipated MOD:  SVD  04/25/2021 PGY1, family medicine resident  03/14/2021, 5:07 PM

## 2021-03-14 NOTE — Progress Notes (Signed)
In-house interpreter, Tonna Corner, at bedside.  Health history and plan of care reviewed with patient.  All questions answered at this time and no concerns voiced.

## 2021-03-14 NOTE — Progress Notes (Signed)
In-house spanish interpreter, Genella Rife, at beside.

## 2021-03-14 NOTE — Progress Notes (Signed)
In-house interpreter, Tonna Corner, at bedside.  Interpreter present for all procedures. Reviewed plan of care with patient.  All questions answered at this time.

## 2021-03-14 NOTE — Discharge Summary (Signed)
Spanish interpreter used via iPad [employer] interpreter for entirety of encounter.     Postpartum Discharge Summary   Patient Name: Latoya Lane DOB: 2000-12-04 MRN: [id number]  Date of admission: 03/14/2021 Delivery date:03/14/2021  Delivering provider: Precious Gilding  Date of discharge: 03/16/2021  Admitting diagnosis: Post-dates pregnancy [O48.0] Intrauterine pregnancy: [redacted]w[redacted]d     Secondary diagnosis:  Principal Problem:   Vaginal delivery Active Problems:   Late prenatal care   Language barrier   Rubella non-immune status, antepartum   Post-dates pregnancy   Fetal growth restriction antepartum  Additional problems: None     Discharge diagnosis: Term Pregnancy Delivered                                              Post partum procedures: None Augmentation: AROM and Pitocin Complications: None  Hospital course: Induction of Labor With Vaginal Delivery   21 y.o. yo G2P1001 at [redacted]w[redacted]d was admitted to the hospital 03/14/2021 for induction of labor.  Indication for induction: Postdates.  Patient had an uncomplicated labor course as follows: Membrane Rupture Time/Date: 4:45 PM ,03/14/2021   Delivery Method:Vaginal, Spontaneous  Episiotomy: None  Lacerations:  None  Details of delivery can be found in separate delivery note.  Patient had a routine postpartum course.  She is eating, drinking, ambulating, and voiding without issue. Patient is discharged home 03/16/21.  Newborn Data: Birth date:03/14/2021  Birth time:5:29 PM  Gender:Female  Living status:Living  Apgars:9 ,9  Weight:3430 g   Magnesium Sulfate received: No BMZ received: No Rhophylac: No MMR: Given postpartum  T-DaP: Given postpartum  Flu: No Transfusion:No  Physical exam  Vitals:   03/15/21 0830 03/15/21 1403 03/15/21 2059 03/16/21 0520  BP: 107/68 112/64 119/85 112/64  Pulse: 75 73 66 70  Resp: $Remo'16 18  18  'eFmcR$ Temp: 98 F (36.7 C) 98.5 F (36.9 C) 98.4 F (36.9 C) 98 F (36.7 C)  TempSrc:  Oral Oral Oral   SpO2: 100% 98% 100%   Weight:      Height:       General: alert, cooperative, and no distress Lochia: appropriate Uterine Fundus: firm and below umbilicus  DVT Evaluation: no LE edema or calf tenderness to palpation   Labs: Lab Results  Component Value Date   WBC 8.4 03/14/2021   HGB 11.0 (L) 03/14/2021   HCT 32.2 (L) 03/14/2021   MCV 84.7 03/14/2021   PLT 280 03/14/2021   CMP Latest Ref Rng & Units 01/29/2021  Glucose 70 - 99 mg/dL 95  BUN 6 - 20 mg/dL 5(L)  Creatinine 0.44 - 1.00 mg/dL 0.48  Sodium 135 - 145 mmol/L 134(L)  Potassium 3.5 - 5.1 mmol/L 3.3(L)  Chloride 98 - 111 mmol/L 104  CO2 22 - 32 mmol/L 22  Calcium 8.9 - 10.3 mg/dL 8.1(L)   Flavia Shipper Score: Edinburgh Postnatal Depression Scale Screening Tool 03/15/2021  I have been able to laugh and see the funny side of things. 0  I have looked forward with enjoyment to things. 2  I have blamed myself unnecessarily when things went wrong. 2  I have been anxious or worried for no good reason. 0  I have felt scared or panicky for no good reason. 2  Things have been getting on top of me. 2  I have been so unhappy that I have had difficulty sleeping. 0  I have felt sad or  miserable. 0  I have been so unhappy that I have been crying. 1  The thought of harming myself has occurred to me. 0  Edinburgh Postnatal Depression Scale Total 9     After visit meds:  Allergies as of 03/16/2021   No Known Allergies      Medication List     STOP taking these medications    oseltamivir 75 MG capsule Commonly known as: TAMIFLU       TAKE these medications    acetaminophen 500 MG tablet Commonly known as: TYLENOL Take 2 tablets (1,000 mg total) by mouth every 8 (eight) hours as needed (pain).   ibuprofen 600 MG tablet Commonly known as: ADVIL Take 1 tablet (600 mg total) by mouth every 6 (six) hours as needed (pain).   Prenatal Vitamin 27-0.8 MG Tabs Take 1 tablet by mouth daily.         Discharge home in  stable condition Infant Feeding: Breast Infant Disposition: home with mother Discharge instruction: per After Visit Summary and Postpartum booklet. Activity: Advance as tolerated. Pelvic rest for 6 weeks.  Diet: routine diet  Follow up Visit: Message sent to [facility] by Dr Higinio Plan.  Please schedule this patient for a In person postpartum visit in 6 weeks with the following provider: Any provider. Additional Postpartum F/U: None   High risk pregnancy complicated by:  Late PNC, suspected FGR Delivery mode:  Vaginal, Spontaneous  Anticipated Birth Control:  Unsure; would like to discuss further at her postpartum visit   03/16/2021 Genia Del, MD

## 2021-03-14 NOTE — H&P (Signed)
OBSTETRIC ADMISSION HISTORY AND PHYSICAL  Latoya Lane is a 21 y.o. female G2P1001 with IUP at [redacted]w[redacted]d by LMP presenting for post-dates IOL. She reports +FMs, No LOF, no VB, no blurry vision, headaches or peripheral edema, and RUQ pain.  She plans on breast and bottle feeding. She is unsure for birth control. She received her prenatal care at [facility]   Dating: By sure LMP --->  Estimated Date of Delivery: 03/09/21  US done on 1/23, awaiting results.   Prenatal History/Complications:  --Late prenatal care (established at 45 weeks)  --Moved here from [place] in 11/2020 to be with family  --Rubella non-immune  --Measuring small for reported dates   Past Medical History: History reviewed. No pertinent past medical history.  Past Surgical History: History reviewed. No pertinent surgical history.  Obstetrical History: OB History     Gravida  2   Para  1   Term  1   Preterm      AB      Living  1      SAB      IAB      Ectopic      Multiple      Live Births  1           Social History Social History   Socioeconomic History   Marital status: Married    Spouse name: Not on file   Number of children: Not on file   Years of education: Not on file   Highest education level: Not on file  Occupational History   Not on file  Tobacco Use   Smoking status: Never   Smokeless tobacco: Never  Vaping Use   Vaping Use: Never used  Substance and Sexual Activity   Alcohol use: Never   Drug use: Never   Sexual activity: Not Currently  Other Topics Concern   Not on file  Social History Narrative   Not on file   Social Determinants of Health   Financial Resource Strain: Not on file  Food Insecurity: Food Insecurity Present   Worried About [place] in the Last Year: Often true   Arboriculturist in the Last Year: Sometimes true  Transportation Needs: Unmet Transportation Needs   Lack of Transportation (Medical): Yes   Lack of Transportation  (Non-Medical): Yes  Physical Activity: Not on file  Stress: Not on file  Social Connections: Not on file    Family History: History reviewed. No pertinent family history.  Allergies: No Known Allergies  Medications Prior to Admission  Medication Sig Dispense Refill Last Dose   Prenatal Vit-Fe Fumarate-FA (PRENATAL VITAMIN) 27-0.8 MG TABS Take 1 tablet by mouth daily. 30 tablet 12 03/13/2021   oseltamivir (TAMIFLU) 75 MG capsule Take 1 capsule (75 mg total) by mouth every 12 (twelve) hours. (Patient not taking: Reported on 02/25/2021) 10 capsule 0      Review of Systems   All systems reviewed and negative except as stated in HPI  Blood pressure 106/73, pulse 68, temperature (!) 97.3 F (36.3 C), temperature source Oral, resp. rate 16, height 5' 1.81" (1.57 m), weight 57.5 kg, last menstrual period 06/02/2020. General appearance: alert, cooperative, and no distress Lungs: clear to auscultation bilaterally Heart: regular rate and rhythm Abdomen: soft, non-tender; bowel sounds normal Pelvic: NEFG Extremities: Homans sign is negative, no sign of DVT Fetal monitoringBaseline: 135 bpm, Variability: Good {> 6 bpm), Accelerations: Reactive, and Decelerations: Absent Uterine activity occasional, about every 7-10  Prenatal labs: ABO, Rh: --/--/PENDING (01/26 0940) Antibody: PENDING (01/26 0759) Rubella: <0.90 (01/09 1103) RPR: Non Reactive (01/09 1103)  HBsAg: Negative (01/09 1103)  HIV: Non Reactive (01/09 1103)  GBS: NEGATIVE/-- (12/13 2129)  Genetic screening not done  Anatomy US late to care   Prenatal Transfer Tool  Maternal Diabetes: No (third trimester a1c WNL)  Genetic Screening: Not done  Maternal Ultrasounds/Referrals: Unknown, awaiting results  Fetal Ultrasounds or other Referrals: None  Maternal Substance Abuse:  No Significant Maternal Medications:  None Significant Maternal Lab Results: None  Results for orders placed or performed during the hospital  encounter of 03/14/21 (from the past 24 hour(s))  CBC   Collection Time: 03/14/21  7:59 AM  Result Value Ref Range   WBC 8.4 4.0 - 10.5 K/uL   RBC 3.80 (L) 3.87 - 5.11 MIL/uL   Hemoglobin 11.0 (L) 12.0 - 15.0 g/dL   HCT 32.2 (L) 36.0 - 46.0 %   MCV 84.7 80.0 - 100.0 fL   MCH 28.9 26.0 - 34.0 pg   MCHC 34.2 30.0 - 36.0 g/dL   RDW 16.4 (H) 11.5 - 15.5 %   Platelets 280 150 - 400 K/uL   nRBC 0.0 0.0 - 0.2 %  Type and screen   Collection Time: 03/14/21  7:59 AM  Result Value Ref Range   ABO/RH(D) PENDING    Antibody Screen PENDING    Sample Expiration      03/17/2021,2359 Performed at [facility], [address] [place], [place] [address]     Patient Active Problem List   Diagnosis Date Noted   Post-dates pregnancy 03/14/2021   Rubella non-immune status, antepartum 03/04/2021   Supervision of other normal pregnancy, antepartum 02/25/2021   Fundal height low for dates, third trimester 02/25/2021   Late prenatal care 02/25/2021   Language barrier 02/25/2021    Assessment/Plan:  Latoya Lane is a 21 y.o. G2P1001 at [redacted]w[redacted]d here for a post-dates IOL.   #Labor: Dated by sure LMP, however late to establish [facility] and awaiting [place] results from 1/23 via [facility]. Hopefully this study should be read and faxed over in the next 1-2 hours. Will wait for results prior to starting IOL.  #Pain: PRN  #FWB: Cat I #ID: GBS PCR negative in 01/2021, however repeat swabbed with a culture on 1/23 that is still pending. No risk factors, will hold off on treatment.  #MOF: Breast and formula  #MOC: Unsure, discussed her options  #Circ: NA (told it was a female via [place])   #Rubella Non-immune: MMR pp.   #Late PNC (38 wks)   Recently moved to [place]: Consult SW pp for additional assistance/resources in the area.   Patriciaann Clan, DO  03/14/2021, 9:02 AM

## 2021-03-15 LAB — CULTURE, BETA STREP (GROUP B ONLY): Strep Gp B Culture: NEGATIVE

## 2021-03-15 NOTE — Clinical Social Work Maternal (Addendum)
CLINICAL SOCIAL WORK MATERNAL/CHILD NOTE  Patient Details  Name: Latoya Lane MRN: [id number] Date of Birth: Apr 09, 2000  Date:  03/15/2021  Clinical Social Worker Initiating Note:  Kathrin Greathouse, [place] Date/Time: Initiated:  03/15/21/0925     Child's Name:  Latoya Lane   Biological Parents:  Mother (Daveena Meda 08-03-98)   Need for Interpreter:  Spanish   Reason for Referral:  Late or No Prenatal Care     Address:  [place] [address]    Phone number:  [phone] (home)     Additional phone number:   Household Members/Support Persons (HM/SP):   Household Member/Support Person 1, Household Member/Support Person 2, Household Member/Support Person 3, Household Member/Support Person 4, Household Member/Support Person 5   HM/SP Name Relationship DOB or Age  HM/SP -1 Floridad Algis Downs 10-20-1981 Mother  HM/SP -2 Leasia Swann Father 01-23-1979  HM/SP -3 Jazmine Heckman Sister 06-01-2003  HM/SP -[place] 11-28-2011 Brother  HM/SP -[place] 06-25-2019 Son  HM/SP -6        HM/SP -7        HM/SP -8          Natural Supports (not living in the home):      Professional Supports: None   Employment: Unemployed   Type of Work:     Education:  Programmer, systems   Homebound arranged:    Museum/gallery curator Resources:  Self-Pay     Other Resources:  [facility]   Cultural/Religious Considerations Which May Impact Care:    Strengths:  Ability to meet basic needs  , Home prepared for child     Psychotropic Medications:         Pediatrician:       Pediatrician List:   [facility]      Pediatrician Fax Number:    Risk Factors/Current Problems:  None   Cognitive State:  Able to Concentrate  , Linear Thinking     Mood/Affect:  Calm  , Comfortable     CSW Assessment: CSW received consult for hx of Late PNC. CSW met with MOB to offer  support and complete assessment.    It is noted that MOB received Edinburgh 9.   CSW met with MOB at bedside and introduced CSW role. CSW observed MOB sitting on the bed watching the infant lying next to her. MOB welcomed CSW visit. MOB confirmed the demographic information on file is correct. MOB reported she, her parents, siblings, and son live together (see chart above). MOB identified her parents and siblings as supports. MOB reported that she and FOB are separated, and he is not involved. MOB reported she recently moved from [place] to the [place]. MOB reported she did not receive PNC in [place] and initiated care when she arrived in the [place]. MOB confirmed she initiated [facility] at 38 weeks. CSW informed MOB about the hospital drug screen policy. CSW made MOB aware that CSW will monitor the infant UDS/CDS and file a reported with CPS, if warranted. MOB understanding.   CSW inquired how MOB has felt since giving birth. MOB reported feeling good and happy about the baby. CSW inquired if MOB has mental health history. MOB reported she does not have a hx of mental health. CSW discussed PPD and provided MOB with resources. CSW provided education regarding the baby blues period vs. perinatal  mood disorders, discussed treatment and gave resources for mental health follow up if concerns arise.  CSW recommends self-evaluation during the postpartum time period using the New Mom Checklist from Postpartum Progress and encouraged MOB to contact a medical professional if symptoms are noted at any time. MOB reported understanding. MOB denied SI/HI.   MOB reported she has a bassinet for the infant to sleep and stated she has limited diapers and wipes. MOB reported she does not have a car seat for the infant and does not have funds to purchase a car seat. MOB accepted the car seat provided by the hospital. CSW provided review of Sudden Infant Death Syndrome (SIDS) precautions. CSW informed MOB about Manufacturing systems engineer. MOB gave  CSW permission to make a referral.   MOB has chosen [facility] for the infant's follow up care at 8:30 AM. MOB reported she does not have transportation to the appointment because her father will need to use their car for work. MOB reported she does not have funds to pay for a taxi or uber. CSW provided MOB with a (2) Taxi vouchers to transport to the infant's appointment location and return home. CSW prearranged pick up with Sharyn Lull at [facility] [phone]) for 03/18/21 $RemoveBef'@8'rMsvqBYqGd$ :00 AM. CSW provided instruction to MOB. MOB reported understanding.   CSW provided MOB with a car seat.   CSW identifies no further need for intervention and no barriers to discharge at this time.   CSW Plan/Description:  CSW Will Continue to Monitor Umbilical Cord Tissue Drug Screen Results and Make Report if Warranted, Sudden Infant Death Syndrome (SIDS) Education, [place], Perinatal Mood and Anxiety Disorder (PMADs) Education, No Further Intervention Required/No Barriers to Discharge    Lia Hopping, LCSW 03/15/2021, 1:19 PM

## 2021-03-15 NOTE — Progress Notes (Signed)
PPD # 1 S/P SVD  Live born female  Birth Weight: 7 lb 9 oz (3430 g) APGAR: 9, 9  Newborn Delivery   Birth date/time: 03/14/2021 17:29:00 Delivery type: Vaginal, Spontaneous     Baby name: Megan Delivering provider: Erick Alley  Episiotomy:None   Lacerations:None   Feeding: Breast and bottle  Pain control at delivery: None   S:  Reports feeling well overall, just a little sore             Tolerating PO/No nausea or vomiting             Bleeding is moderate Passing flatus             Pain controlled with ibuprofen (OTC)             Up ad lib/ambulatory/voiding without difficulties   O:  A & O x 3, in no apparent distress              VS:  Vitals:   03/14/21 1830 03/14/21 1854 03/15/21 0017 03/15/21 0410  BP: 114/63 (!) 117/91 118/80 111/75  Pulse: 64 76 76 77  Resp: 18 16 15 15   Temp:  98.4 F (36.9 C) 98.6 F (37 C) 98.2 F (36.8 C)  TempSrc:  Axillary Oral Oral  SpO2:   99% 100%  Weight:      Height:        LABS:  Recent Labs    03/14/21 0759  WBC 8.4  HGB 11.0*  HCT 32.2*  PLT 280    Blood type: --/--/O POS (01/26 0759)  Rubella: <0.90 (01/09 1103)   I&O: I/O last 3 completed shifts: In: -  Out: 250 [Blood:250]          No intake/output data recorded.  Vaccines: TDaP          Declined                   COVID-19  UTD        Flu    UTD   Gen: AAO x 3, NAD  Abdomen: soft, non-tender, non-distended            Fundus: firm, non-tender, U-above and displaced to the right  Perineum: intact  Lochia: moderate  Extremities: no edema, no calf pain or tenderness    A/P:  PPD # 1 21 y.o., 09-16-1995   Principal Problem:   Post-dates pregnancy Active Problems:   Late prenatal care   Language barrier   Rubella non-immune status, antepartum   Fetal growth restriction antepartum   -Doing well - stable status -Lactation support as needed -Routine voiding  -Routine post partum orders -Anticipate discharge tomorrow -Nexplanon prior to discharge      [id number] 03/15/2021, 5:53 AM

## 2021-03-15 NOTE — Lactation Note (Signed)
This note was copied from a baby's chart. Lactation Consultation Note  Patient Name: Latoya Lane [id number] Date: 03/15/2021 Reason for consult: Follow-up assessment Age:23 hours   Spanish Interpreter Deatra Ina [phone] used via video.  P2, Mother is primarily formula feeding.  She breastfed baby last night.  Encouraged mother to offer breast first before formula to help establish her milk supply. Feed on demand with cues.  Goal 8-12+ times per day after first 24 hrs.  Place baby STS if not cueing.  Reviewed engorgement care and monitoring voids/stools.    Feeding Mother's Current Feeding Choice: Breast Milk and Formula Nipple Type: Nfant Standard Flow (white) (yellow was too fast)   Interventions Interventions: Education  Discharge Discharge Education: Engorgement and breast care;Warning signs for feeding baby  Consult Status Consult Status: Complete Date: 03/15/21    Dahlia Byes [facility] 03/15/2021, 10:58 AM

## 2021-03-15 NOTE — Progress Notes (Signed)
Rn used in Education administrator.

## 2021-03-16 MED ORDER — ACETAMINOPHEN 500 MG PO TABS: 1000.0000 mg | ORAL_TABLET | Freq: Three times a day (TID) | ORAL | 0 refills | Status: AC | PRN

## 2021-03-16 MED ORDER — IBUPROFEN 600 MG PO TABS: 600.0000 mg | ORAL_TABLET | Freq: Four times a day (QID) | ORAL | 0 refills | Status: AC | PRN

## 2021-03-16 NOTE — Plan of Care (Signed)
Problem: Clinical Measurements: Goal: Ability to maintain clinical measurements within normal limits will improve Outcome: Completed/Met Goal: Will remain free from infection Outcome: Completed/Met Goal: Diagnostic test results will improve Outcome: Completed/Met Goal: Respiratory complications will improve Outcome: Completed/Met Goal: Cardiovascular complication will be avoided Outcome: Completed/Met   Problem: Education: Goal: Knowledge of condition will improve Outcome: Completed/Met   Problem: Activity: Goal: Will verbalize the importance of balancing activity with adequate rest periods Outcome: Completed/Met

## 2021-03-16 NOTE — Lactation Note (Signed)
This note was copied from a baby's chart. Lactation Consultation Note  Patient Name: Latoya Lane [id number] Date: 03/16/2021 Reason for consult: Follow-up assessment Age:23 hours  Spanish interpreter [place] used [phone] via video. Mother's breasts are filling. Mother has been breastfeeding more this morning. Feed on demand with cues.  Goal 8-12+ times per day after first 24 hrs.  Place baby STS if not cueing.  Reviewed engorgement care and monitoring voids/stools.  Feeding Mother's Current Feeding Choice: Breast Milk and Formula   Interventions Interventions: Breast feeding basics reviewed;Education  Discharge    Consult Status Consult Status: Complete Date: 03/16/21    Dahlia Byes [facility] 03/16/2021, 8:45 AM

## 2021-03-17 ENCOUNTER — Telehealth: Payer: Self-pay | Admitting: Radiology

## 2021-03-17 NOTE — Telephone Encounter (Signed)
Left voicemail with postpartum appointment information of 04/22/21 @ 3:15 with Mallie Snooks via interpreter.

## 2021-03-26 ENCOUNTER — Telehealth (HOSPITAL_COMMUNITY): Payer: Self-pay | Admitting: *Deleted

## 2021-03-26 NOTE — Telephone Encounter (Signed)
Attempted hospital discharge follow-up phone call with [address], Royetta Asal [phone]. No answer received. Deforest Hoyles, RN, 03/26/21, [phone]

## 2021-04-22 ENCOUNTER — Ambulatory Visit: Payer: Self-pay | Admitting: Advanced Practice Midwife

## 2021-04-22 ENCOUNTER — Ambulatory Visit: Payer: Self-pay | Admitting: Obstetrics & Gynecology
